# Patient Record
Sex: Male | Born: 1970 | Race: Black or African American | Hispanic: No | Marital: Single | Smoking: Never smoker
Health system: Southern US, Community
[De-identification: ages and names within clinical notes are randomized; demographics above are authoritative.]

## PROBLEM LIST (undated history)

## (undated) DIAGNOSIS — M109 Gout, unspecified: Secondary | ICD-10-CM

## (undated) DIAGNOSIS — M1990 Unspecified osteoarthritis, unspecified site: Secondary | ICD-10-CM

## (undated) DIAGNOSIS — K429 Umbilical hernia without obstruction or gangrene: Secondary | ICD-10-CM

## (undated) DIAGNOSIS — E119 Type 2 diabetes mellitus without complications: Secondary | ICD-10-CM

## (undated) DIAGNOSIS — R112 Nausea with vomiting, unspecified: Secondary | ICD-10-CM

## (undated) DIAGNOSIS — R519 Headache, unspecified: Secondary | ICD-10-CM

## (undated) DIAGNOSIS — R7303 Prediabetes: Secondary | ICD-10-CM

## (undated) DIAGNOSIS — G4730 Sleep apnea, unspecified: Secondary | ICD-10-CM

## (undated) DIAGNOSIS — I10 Essential (primary) hypertension: Secondary | ICD-10-CM

## (undated) DIAGNOSIS — Z9889 Other specified postprocedural states: Secondary | ICD-10-CM

## (undated) HISTORY — DX: Morbid (severe) obesity due to excess calories: E66.01

## (undated) HISTORY — PX: KNEE ARTHROSCOPY: SUR90

## (undated) HISTORY — DX: Umbilical hernia without obstruction or gangrene: K42.9

## (undated) HISTORY — DX: Sleep apnea, unspecified: G47.30

## (undated) HISTORY — DX: Gout, unspecified: M10.9

---

## 2016-02-06 ENCOUNTER — Encounter (HOSPITAL_COMMUNITY): Payer: Self-pay | Admitting: Emergency Medicine

## 2016-02-06 ENCOUNTER — Emergency Department (HOSPITAL_COMMUNITY)
Admission: EM | Admit: 2016-02-06 | Discharge: 2016-02-06 | Disposition: A | Discharge status: Home / Self Care | Payer: 59 | Attending: Emergency Medicine | Admitting: Emergency Medicine

## 2016-02-06 DIAGNOSIS — J039 Acute tonsillitis, unspecified: Secondary | ICD-10-CM | POA: Insufficient documentation

## 2016-02-06 DIAGNOSIS — I10 Essential (primary) hypertension: Secondary | ICD-10-CM | POA: Insufficient documentation

## 2016-02-06 DIAGNOSIS — E119 Type 2 diabetes mellitus without complications: Secondary | ICD-10-CM | POA: Insufficient documentation

## 2016-02-06 DIAGNOSIS — J029 Acute pharyngitis, unspecified: Secondary | ICD-10-CM | POA: Diagnosis present

## 2016-02-06 HISTORY — DX: Essential (primary) hypertension: I10

## 2016-02-06 HISTORY — DX: Type 2 diabetes mellitus without complications: E11.9

## 2016-02-06 LAB — RAPID STREP SCREEN (MED CTR MEBANE ONLY): STREPTOCOCCUS, GROUP A SCREEN (DIRECT): NEGATIVE

## 2016-02-06 MED ORDER — HYDROCODONE-ACETAMINOPHEN 5-325 MG PO TABS
2.0000 | ORAL_TABLET | Freq: Once | ORAL | Status: AC
  Administered 2016-02-06: 2 via ORAL
  Filled 2016-02-06: qty 2

## 2016-02-06 MED ORDER — HYDROCODONE-ACETAMINOPHEN 5-325 MG PO TABS: 1.0000 | ORAL_TABLET | Freq: Four times a day (QID) | ORAL | 0 refills | Status: DC | PRN

## 2016-02-06 MED ORDER — CLINDAMYCIN HCL 150 MG PO CAPS: 300.0000 mg | ORAL_CAPSULE | Freq: Three times a day (TID) | ORAL | 0 refills | Status: DC

## 2016-02-06 MED ORDER — DEXAMETHASONE SODIUM PHOSPHATE 10 MG/ML IJ SOLN
10.0000 mg | Freq: Once | INTRAMUSCULAR | Status: AC
  Administered 2016-02-06: 10 mg via INTRAMUSCULAR
  Filled 2016-02-06: qty 1

## 2016-02-06 NOTE — ED Notes (Signed)
Declined W/C at D/C and was escorted to lobby by RN. 

## 2016-02-06 NOTE — ED Provider Notes (Signed)
[facility] DEPT Provider Note   CSN: [id number] Arrival date & time: 02/06/16  1413     History   Chief Complaint Chief Complaint  Patient presents with  . Sore Throat    HPI Edgar Myers is a 45 y.o. male.  Patient presents to the emergency department with chief complaint of sore throat. He states that he was sent to the emergency department by a [facility] to be reassessed for possible peritonsillar abscess. Patient reports having a sore throat for the past 3-4 days. He denies any known fever, but is noted to be febrile today to 101.6. He denies any cough, but states that he has had sinus congestion. States that he saw his primary care doctor yesterday for the sinus congestion, and was prescribed doxycycline. He has been taking this. He states that his throat hurt so much it is hard to swallow, but he is able to swallow.   The history is provided by the patient. No language interpreter was used.    Past Medical History:  Diagnosis Date  . Diabetes mellitus without complication ([place])    pre-diabetic  . Hypertension     There are no active problems to display for this patient.   History reviewed. No pertinent surgical history.     Home Medications    Prior to Admission medications   Not on File    Family History History reviewed. No pertinent family history.  Social History Social History  Substance Use Topics  . Smoking status: Never Smoker  . Smokeless tobacco: Never Used  . Alcohol use No     Allergies   Review of patient's allergies indicates no known allergies.   Review of Systems Review of Systems  HENT: Positive for sore throat.   All other systems reviewed and are negative.    Physical Exam Updated Vital Signs BP 114/65 (BP Location: Right Arm)   Pulse 81   Temp 101.6 F (38.7 C) (Oral)   Ht 5\' 10"  (1.778 m)   Wt (!) 190.5 kg   SpO2 96%   BMI 60.26 kg/m   Physical Exam  Constitutional: He is oriented to person, place,  and time. He appears well-developed and well-nourished.  HENT:  Head: Normocephalic and atraumatic.  Bilateral oropharynx is remarkable for swollen tonsils with exudates, without evidence of peritonsillar abscess, uvula is midline, airway intact, tolerating secretions  Eyes: Conjunctivae and EOM are normal. Pupils are equal, round, and reactive to light. Right eye exhibits no discharge. Left eye exhibits no discharge. No scleral icterus.  Neck: Normal range of motion. Neck supple. No JVD present.  Cardiovascular: Normal rate, regular rhythm and normal heart sounds.  Exam reveals no gallop and no friction rub.   No murmur heard. Pulmonary/Chest: Effort normal and breath sounds normal. No respiratory distress. He has no wheezes. He has no rales. He exhibits no tenderness.  Abdominal: Soft. He exhibits no distension and no mass. There is no tenderness. There is no rebound and no guarding.  Musculoskeletal: Normal range of motion. He exhibits no edema or tenderness.  Neurological: He is alert and oriented to person, place, and time.  Skin: Skin is warm and dry.  Psychiatric: He has a normal mood and affect. His behavior is normal. Judgment and thought content normal.  Nursing note and vitals reviewed.    ED Treatments / Results  Labs (all labs ordered are listed, but only abnormal results are displayed) Labs Reviewed  RAPID STREP SCREEN (NOT AT [facility])    EKG  EKG Interpretation None       Radiology No results found.  Procedures Procedures (including critical care time)  Medications Ordered in ED Medications  HYDROcodone-acetaminophen (NORCO/VICODIN) 5-325 MG per tablet 2 tablet (2 tablets Oral Given 02/06/16 1458)  dexamethasone (DECADRON) injection 10 mg (10 mg Intramuscular Given 02/06/16 1527)     Initial Impression / Assessment and Plan / ED Course  I have reviewed the triage vital signs and the nursing notes.  Pertinent labs & imaging results that were available during  my care of the patient were reviewed by me and considered in my medical decision making (see chart for details).  Clinical Course    Patient with tonsillitis. There is no evidence of peritonsillar abscess. He is tolerating orals. Given Vicodin, which should help with fever. I will prescribe clindamycin given the severe case of tonsillitis.  Also given 10mg  decadron in the ED.  Vitals:   02/06/16 1418 02/06/16 1534  BP: 114/65 118/68  Pulse: 81 79  Resp:  18  Temp: 101.6 F (38.7 C) 100.6 F (38.1 C)    Final Clinical Impressions(s) / ED Diagnoses   Final diagnoses:  Tonsillitis    New Prescriptions New Prescriptions   CLINDAMYCIN (CLEOCIN) 150 MG CAPSULE    Take 2 capsules (300 mg total) by mouth 3 (three) times daily. May dispense as 150mg  capsules   HYDROCODONE-ACETAMINOPHEN (NORCO/VICODIN) 5-325 MG TABLET    Take 1-2 tablets by mouth every 6 (six) hours as needed.         Montine Circle, PA-C 02/06/16 Aguilar, MD 02/11/16 [id number]

## 2016-02-06 NOTE — ED Triage Notes (Signed)
Sent from [facility] for sore throat for 5 days, enlarged tonsils, and concern for tonsillar abscess.

## 2016-02-08 LAB — CULTURE, GROUP A STREP (THRC)

## 2017-02-01 ENCOUNTER — Other Ambulatory Visit (HOSPITAL_COMMUNITY): Payer: Self-pay | Admitting: General Surgery

## 2017-06-22 ENCOUNTER — Encounter: Payer: Self-pay | Admitting: Skilled Nursing Facility1

## 2017-06-22 ENCOUNTER — Encounter: Payer: 59 | Attending: General Surgery | Admitting: Skilled Nursing Facility1

## 2017-06-22 DIAGNOSIS — G4733 Obstructive sleep apnea (adult) (pediatric): Secondary | ICD-10-CM | POA: Diagnosis not present

## 2017-06-22 DIAGNOSIS — Z8249 Family history of ischemic heart disease and other diseases of the circulatory system: Secondary | ICD-10-CM | POA: Diagnosis not present

## 2017-06-22 DIAGNOSIS — E119 Type 2 diabetes mellitus without complications: Secondary | ICD-10-CM

## 2017-06-22 DIAGNOSIS — Z6841 Body Mass Index (BMI) 40.0 and over, adult: Secondary | ICD-10-CM | POA: Insufficient documentation

## 2017-06-22 DIAGNOSIS — Z79899 Other long term (current) drug therapy: Secondary | ICD-10-CM | POA: Insufficient documentation

## 2017-06-22 DIAGNOSIS — Z713 Dietary counseling and surveillance: Secondary | ICD-10-CM | POA: Diagnosis present

## 2017-06-22 DIAGNOSIS — E785 Hyperlipidemia, unspecified: Secondary | ICD-10-CM | POA: Diagnosis not present

## 2017-06-22 DIAGNOSIS — M5441 Lumbago with sciatica, right side: Secondary | ICD-10-CM | POA: Insufficient documentation

## 2017-06-22 DIAGNOSIS — M5442 Lumbago with sciatica, left side: Secondary | ICD-10-CM | POA: Diagnosis not present

## 2017-06-22 DIAGNOSIS — I10 Essential (primary) hypertension: Secondary | ICD-10-CM | POA: Insufficient documentation

## 2017-06-22 DIAGNOSIS — Z7984 Long term (current) use of oral hypoglycemic drugs: Secondary | ICD-10-CM | POA: Insufficient documentation

## 2017-06-22 DIAGNOSIS — Z8261 Family history of arthritis: Secondary | ICD-10-CM | POA: Diagnosis not present

## 2017-06-22 NOTE — Progress Notes (Signed)
Pre-Op Assessment Visit:  Pre-Operative Sleeve Gastrecomty Surgery  Medical Nutrition Therapy:  Appt start time: 8:50  End time:  9:50  Patient was seen on 06/22/2017 for Pre-Operative Nutrition Assessment. Assessment and letter of approval faxed to [facility] Bariatric Surgery Program coordinator on 06/22/2017.   Pt arrived with his wife. Pt states he has never been educated on diabetes. Pt states he has had diabetes for about 2 years with not checking his blood sugar and does not know what his A1C is or what it is in general. Pt states he does not wear his C-PAP. Pts work schedule Rotating 12 hours night and day. Pt admitted the changes to his diet and moving more will be difficult.  Educate pt on diabetes for next time: difference between type 1 and type 2.   Surgeon wants pt to lose 30-40 pounds before surgery.   Pt expectation of surgery: To get healthy what do u expec tyuor life to be lik   Pt expectation of Dietitian: to inform me   Start weight at NDES: 456 BMI: 67.34  24 hr Dietary Recall: First Meal:  Snack: cookies Second Meal:  Snack: ice cream Third Meal: fast food Snack: chips Beverages: sweet tea, soda, lemonade, cool aid, water  Encouraged to engage in 150 minutes of moderate physical activity including cardiovascular and weight baring weekly  Handouts given during visit include:  . Pre-Op Goals . Bariatric Surgery Protein Shakes During the appointment today the following Pre-Op Goals were reviewed with the patient: . Maintain or lose weight as instructed by your surgeon . Make healthy food choices . Begin to limit portion sizes . Limited concentrated sugars and fried foods . Keep fat/sugar in the single digits per serving on             food labels . Practice CHEWING your food  (aim for 30 chews per bite or until applesauce consistency) . Practice not drinking 15 minutes before, during, and 30 minutes after each meal/snack . Avoid all  carbonated beverages  . Avoid/limit caffeinated beverages  . Avoid all sugar-sweetened beverages . Consume 3 meals per day; eat every 3-5 hours . Make a list of non-food related activities . Aim for 64-100 ounces of FLUID daily  . Aim for at least 60-80 grams of PROTEIN daily . Look for a liquid protein source that contain ?15 g protein and ?5 g carbohydrate  (ex: shakes, drinks, shots)  -Follow diet recommendations listed below   Energy and Macronutrient Recomendations: Calories: 1600 Carbohydrate: 180 Protein: 120 Fat: 44  Demonstrated degree of understanding via:  Teach Back  Teaching Method Utilized:  Visual Auditory Hands on  Barriers to learning/adherence to lifestyle change: work schedule  Patient to call the Nutrition and Diabetes Education Services to enroll in Pre-Op and Post-Op Nutrition Education when surgery date is scheduled.

## 2017-06-22 NOTE — Patient Instructions (Addendum)
-  Try to wear your C-PAP every night   -Log your food for 7 days

## 2017-06-30 ENCOUNTER — Encounter: Payer: Self-pay | Admitting: *Deleted

## 2017-07-01 ENCOUNTER — Encounter: Payer: Self-pay | Admitting: *Deleted

## 2017-07-01 ENCOUNTER — Other Ambulatory Visit: Payer: Self-pay

## 2017-07-01 ENCOUNTER — Telehealth: Payer: Self-pay | Admitting: Cardiology

## 2017-07-01 ENCOUNTER — Ambulatory Visit: Payer: 59 | Admitting: Cardiology

## 2017-07-01 ENCOUNTER — Encounter: Payer: Self-pay | Admitting: Cardiology

## 2017-07-01 VITALS — BP 129/86 | HR 58 | Ht 70.0 in | Wt >= 6400 oz

## 2017-07-01 DIAGNOSIS — R0602 Shortness of breath: Secondary | ICD-10-CM | POA: Diagnosis not present

## 2017-07-01 MED ORDER — FUROSEMIDE 40 MG PO TABS: 40.0000 mg | ORAL_TABLET | Freq: Every day | ORAL | 1 refills | Status: DC

## 2017-07-01 MED ORDER — BENAZEPRIL HCL 20 MG PO TABS: 20.0000 mg | ORAL_TABLET | Freq: Every day | ORAL | 1 refills | Status: DC

## 2017-07-01 NOTE — Progress Notes (Signed)
     Clinical Summary Edgar Myers is a 47 y.o.male seen as new consult,, referred by Dr Amanda Pea for preoperative evaluation.   1. Preoperative evaluation - being considered for bariatric surgery - denies any chest pain. 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 - can walk 2+ flights, has some SOB with it.  - can have some LE edema x few years. No orthopnea  CAD risk factors: DM2, HTN - prior stress test at [facility] per his report.   2. DM2 - followed by pcp  3. OSA - poor compliance with cpap.   Past Medical History:  Diagnosis Date  . Diabetes mellitus without complication ([place])    pre-diabetic  . Hypertension      No Known Allergies   Current Outpatient Medications  Medication Sig Dispense Refill  . clindamycin (CLEOCIN) 150 MG capsule Take 2 capsules (300 mg total) by mouth 3 (three) times daily. May dispense as 150mg  capsules 42 capsule 0  . HYDROcodone-acetaminophen (NORCO/VICODIN) 5-325 MG tablet Take 1-2 tablets by mouth every 6 (six) hours as needed. 10 tablet 0   No current facility-administered medications for this visit.      No past surgical history on file.   No Known Allergies    No family history on file.   Social History Edgar Myers reports that he has never smoked. He has never used smokeless tobacco. Edgar Myers reports that he does not drink alcohol.   Review of Systems CONSTITUTIONAL: No weight loss, fever, chills, weakness or fatigue.  HEENT: Eyes: No visual loss, blurred vision, double vision or yellow sclerae.No hearing loss, sneezing, congestion, runny nose or sore throat.  SKIN: No rash or itching.  CARDIOVASCULAR: per hpi RESPIRATORY: per hpi GASTROINTESTINAL: No anorexia, nausea, vomiting or diarrhea. No abdominal pain or blood.  GENITOURINARY: No burning on urination, no polyuria NEUROLOGICAL: No headache, dizziness, syncope, paralysis, ataxia, numbness or tingling in the extremities. No change in  bowel or bladder control.  MUSCULOSKELETAL: No muscle, back pain, joint pain or stiffness.  LYMPHATICS: No enlarged nodes. No history of splenectomy.  PSYCHIATRIC: No history of depression or anxiety.  ENDOCRINOLOGIC: No reports of sweating, cold or heat intolerance. No polyuria or polydipsia.  Marland Kitchen   Physical Examination Vitals:   07/01/17 1335  BP: 129/86  Pulse: (!) 58  SpO2: 95%   Vitals:   07/01/17 1335  Weight: (!) 456 lb (206.8 kg)  Height: 5\' 10"  (1.778 m)    Gen: resting comfortably, no acute distress HEENT: no scleral icterus, pupils equal round and reactive, no palptable cervical adenopathy,  CV: RRR, no m/r/g, no jvd Resp: Clear to auscultation bilaterally GI: abdomen is soft, non-tender, non-distended, normal bowel sounds, no hepatosplenomegaly MSK: extremities are warm, no edema.  Skin: warm, no rash Neuro:  no focal deficits Psych: appropriate affect     Assessment and Plan  1. Preoperative evaluation - patient being considered for bariatric surgery - has had some recent DOE, LE edema at times - we will obtain an echo to evaluate for underlying cardiac dysfunction.  - by report he can tolerate greater than 4 METs at home though does have some symptoms at this level.  - f/u echo results. His weight would limit his candidacy for any noninvasive ischemic testing, unless by chance he happens to have clear echo windows then could consider DSE if indicated       Arnoldo Lenis, M.D.

## 2017-07-01 NOTE — Telephone Encounter (Signed)
Pre-cert Verification for the following procedure   Echo scheduled for 07-06-17 at [facility]

## 2017-07-01 NOTE — Patient Instructions (Signed)
Your physician recommends that you schedule a follow-up appointment in: TO BE DETERMINED WITH DR [facility]  Your physician has recommended you make the following change in your medication:   STOP LOTENSIN-HCTZ  START BENAZEPRIL 20 MG DAILY  START LASIX 103 MG DAILY  Your physician recommends that you return for lab work in: [place] BMP/MG - [place] physician has requested that you have an echocardiogram. Echocardiography is a painless test that uses sound waves to create images of your heart. It provides your doctor with information about the size and shape of your heart and how well your heart's chambers and valves are working. This procedure takes approximately one hour. There are no restrictions for this procedure.  Thank you for choosing [facility]!!

## 2017-07-04 ENCOUNTER — Encounter: Payer: Self-pay | Admitting: Cardiology

## 2017-07-06 ENCOUNTER — Ambulatory Visit (HOSPITAL_COMMUNITY)
Admission: RE | Admit: 2017-07-06 | Discharge: 2017-07-06 | Disposition: A | Discharge status: Home / Self Care | Payer: 59 | Source: Ambulatory Visit | Attending: Cardiology | Admitting: Cardiology

## 2017-07-06 DIAGNOSIS — I119 Hypertensive heart disease without heart failure: Secondary | ICD-10-CM | POA: Diagnosis not present

## 2017-07-06 DIAGNOSIS — E119 Type 2 diabetes mellitus without complications: Secondary | ICD-10-CM | POA: Insufficient documentation

## 2017-07-06 DIAGNOSIS — R0602 Shortness of breath: Secondary | ICD-10-CM | POA: Insufficient documentation

## 2017-07-06 MED ORDER — PERFLUTREN LIPID MICROSPHERE
1.0000 mL | INTRAVENOUS | Status: AC | PRN
  Administered 2017-07-06: 2 mL via INTRAVENOUS
  Filled 2017-07-06: qty 10

## 2017-07-06 NOTE — Progress Notes (Signed)
*  PRELIMINARY RESULTS* Echocardiogram 2D Echocardiogram WITH DEFINITY has been performed.  Leavy Cella 07/06/2017, 12:32 PM

## 2017-07-15 ENCOUNTER — Encounter: Payer: Self-pay | Admitting: *Deleted

## 2017-07-15 ENCOUNTER — Telehealth: Payer: Self-pay | Admitting: Cardiology

## 2017-07-15 NOTE — Telephone Encounter (Signed)
LM to return call    Echo looks good, normal heart function. I dont' see anything to keep him from pursuing bariatric surgery if needed. Can we get his prior stress test result from Robley Fries MD

## 2017-07-15 NOTE — Telephone Encounter (Signed)
Pt aware and voiced understanding - routed to pcp and Dr [name]. Will confirm with Dr Harl Bowie when pt needs to f/u - pt says he will be having labs done at [facility] this week.

## 2017-07-15 NOTE — Telephone Encounter (Signed)
Returning call.

## 2017-07-15 NOTE — Telephone Encounter (Signed)
Mr. Fan called back to [place] office requesting to speak with Staci in regards to having weight loss surgery.Does patient need to come back for another appointment. Please call [phone].

## 2017-07-21 ENCOUNTER — Encounter: Payer: 59 | Attending: General Surgery | Admitting: Skilled Nursing Facility1

## 2017-07-21 ENCOUNTER — Encounter: Payer: Self-pay | Admitting: Skilled Nursing Facility1

## 2017-07-21 DIAGNOSIS — M5441 Lumbago with sciatica, right side: Secondary | ICD-10-CM | POA: Insufficient documentation

## 2017-07-21 DIAGNOSIS — M5442 Lumbago with sciatica, left side: Secondary | ICD-10-CM | POA: Diagnosis not present

## 2017-07-21 DIAGNOSIS — E119 Type 2 diabetes mellitus without complications: Secondary | ICD-10-CM | POA: Insufficient documentation

## 2017-07-21 DIAGNOSIS — Z6841 Body Mass Index (BMI) 40.0 and over, adult: Secondary | ICD-10-CM | POA: Diagnosis not present

## 2017-07-21 DIAGNOSIS — Z713 Dietary counseling and surveillance: Secondary | ICD-10-CM | POA: Diagnosis present

## 2017-07-21 DIAGNOSIS — Z79899 Other long term (current) drug therapy: Secondary | ICD-10-CM | POA: Diagnosis not present

## 2017-07-21 DIAGNOSIS — E785 Hyperlipidemia, unspecified: Secondary | ICD-10-CM | POA: Insufficient documentation

## 2017-07-21 DIAGNOSIS — I10 Essential (primary) hypertension: Secondary | ICD-10-CM | POA: Insufficient documentation

## 2017-07-21 DIAGNOSIS — Z7984 Long term (current) use of oral hypoglycemic drugs: Secondary | ICD-10-CM | POA: Insufficient documentation

## 2017-07-21 DIAGNOSIS — G4733 Obstructive sleep apnea (adult) (pediatric): Secondary | ICD-10-CM | POA: Insufficient documentation

## 2017-07-21 DIAGNOSIS — Z8261 Family history of arthritis: Secondary | ICD-10-CM | POA: Diagnosis not present

## 2017-07-21 DIAGNOSIS — Z8249 Family history of ischemic heart disease and other diseases of the circulatory system: Secondary | ICD-10-CM | POA: Diagnosis not present

## 2017-07-21 NOTE — Patient Instructions (Addendum)
-  Wear your C-PAP every night: start by cleaning   -Put balanced meals together using your sheet  -Keep working on getting in your water and cutting back on soda  -Chew until applesauce consistency

## 2017-07-21 NOTE — Progress Notes (Signed)
Pre-Op Assessment Visit:  Pre-Operative Sleeve Gastrecomty Surgery  Medical Nutrition Therapy:  Appt start time: 8:50  End time:  9:50  Patient was seen on 06/22/2017 for Pre-Operative Nutrition Assessment. Assessment and letter of approval faxed to [facility] Bariatric Surgery Program coordinator on 06/22/2017.   Needs 6 months SWL.   Pt arrived with his wife. Pt states he has never been educated on diabetes. Pt states he has had diabetes for about 2 years with not checking his blood sugar and does not know what his A1C is or what it is in general and states he has not been taking the metformin for the past year and thinks he really does not have it stating he checks his blood suagr randomly always under 100. Pt states he does not wear his C-PAP. Pts work schedule Rotating 12 hours night and day. Pt admitted the changes to his diet and moving more will be difficult.   Surgeon wants pt to lose 30-40 pounds before surgery.  Pt arrives having gained about 2 pounds. Pt states he had had a fluid pill added. Pt states he has been drinking more water, trying to eat 3 times day,;Pt states the long walk into work is breathing better. Pt states he started off good eating cereal and oatmeal stating there is an in [employer] used to be grits and bacon, staying away from soda at work. Cut out mcdonalds and the sweet tea. pts wife lives in [place] and does not live with pt full time.  Handouts given: Meal ideas  Start weight at NDES: 456 Weight: 458 BMI: 67.63  24 hr Dietary Recall: First Meal:  Snack: cookies Second Meal:  Snack: ice cream Third Meal: fast food Snack: chips Beverages: sweet tea, soda, lemonade, cool aid, water  Encouraged to engage in 150 minutes of moderate physical activity including cardiovascular and weight baring weekly  Goals: -Wear your C-PAP every night: start by cleaning   -Put balanced meals together using your sheet  -Keep working on getting in  your water and cutting back on soda  -Chew until applesauce consistency    -Follow diet recommendations listed below   Energy and Macronutrient Recomendations: Calories: 1600 Carbohydrate: 180 Protein: 120 Fat: 44  Demonstrated degree of understanding via:  Teach Back  Teaching Method Utilized:  Visual Auditory Hands on  Barriers to learning/adherence to lifestyle change: work schedule  Patient to call the Nutrition and Diabetes Education Services to enroll in Pre-Op and Post-Op Nutrition Education when surgery date is scheduled.

## 2017-08-18 ENCOUNTER — Encounter: Payer: 59 | Attending: General Surgery | Admitting: Skilled Nursing Facility1

## 2017-08-18 ENCOUNTER — Encounter: Payer: Self-pay | Admitting: Skilled Nursing Facility1

## 2017-08-18 DIAGNOSIS — Z713 Dietary counseling and surveillance: Secondary | ICD-10-CM | POA: Insufficient documentation

## 2017-08-18 DIAGNOSIS — Z7984 Long term (current) use of oral hypoglycemic drugs: Secondary | ICD-10-CM | POA: Insufficient documentation

## 2017-08-18 DIAGNOSIS — M5442 Lumbago with sciatica, left side: Secondary | ICD-10-CM | POA: Diagnosis not present

## 2017-08-18 DIAGNOSIS — E119 Type 2 diabetes mellitus without complications: Secondary | ICD-10-CM | POA: Diagnosis not present

## 2017-08-18 DIAGNOSIS — Z6841 Body Mass Index (BMI) 40.0 and over, adult: Secondary | ICD-10-CM | POA: Insufficient documentation

## 2017-08-18 DIAGNOSIS — Z8261 Family history of arthritis: Secondary | ICD-10-CM | POA: Insufficient documentation

## 2017-08-18 DIAGNOSIS — I10 Essential (primary) hypertension: Secondary | ICD-10-CM | POA: Insufficient documentation

## 2017-08-18 DIAGNOSIS — Z8249 Family history of ischemic heart disease and other diseases of the circulatory system: Secondary | ICD-10-CM | POA: Diagnosis not present

## 2017-08-18 DIAGNOSIS — G4733 Obstructive sleep apnea (adult) (pediatric): Secondary | ICD-10-CM | POA: Insufficient documentation

## 2017-08-18 DIAGNOSIS — Z79899 Other long term (current) drug therapy: Secondary | ICD-10-CM | POA: Insufficient documentation

## 2017-08-18 DIAGNOSIS — E785 Hyperlipidemia, unspecified: Secondary | ICD-10-CM | POA: Diagnosis not present

## 2017-08-18 DIAGNOSIS — M5441 Lumbago with sciatica, right side: Secondary | ICD-10-CM | POA: Diagnosis not present

## 2017-08-18 DIAGNOSIS — E669 Obesity, unspecified: Secondary | ICD-10-CM

## 2017-08-18 NOTE — Progress Notes (Signed)
Pre-Op Assessment Visit:  Pre-Operative Sleeve Gastrecomty Surgery  Medical Nutrition Therapy:  Appt start time: 8:50  End time:  9:50  Patient was seen on 06/22/2017 for Pre-Operative Nutrition Assessment. Assessment and letter of approval faxed to [facility] Bariatric Surgery Program coordinator on 06/22/2017.   Needs 6 months SWL.   Surgeon wants pt to lose 30-40 pounds before surgery.  Pt states his wife is 7 months along.  Pt arrives having lost about 6 pounds. Pt states he has not been wearing his C-PAP. Pt states he no longer eats at [employer]. Pt states he is slowly but surely trying to be mindful about what he eats and how much he eats. Pt states as hotter temperatures occur he drinks a significant amount of caloric drinks and does not eat as much. Pt states he has used the meal ideas sheet a couple of times to put balanced meals together and is going to continue to work on that.   Start weight at NDES: 456 Weight: 458 BMI: 67.63  24 hr Dietary Recall: First Meal:  Snack: cookies Second Meal:  Snack: ice cream Third Meal: fast food Snack: chips Beverages: sweet tea, soda, lemonade, cool aid, water  Encouraged to engage in 150 minutes of moderate physical activity including cardiovascular and weight baring weekly  Goals: -Try the sugar free popcicles, sugar free natures twist, any sugar free powders or drops -Try to wear your C-PAP -Continue to put balanced meals together  -Aim for the lighter/crisper foods instead of caloric drinks -Continue to chew well  -Follow diet recommendations listed below   Energy and Macronutrient Recomendations: Calories: 1600 Carbohydrate: 180 Protein: 120 Fat: 44  Demonstrated degree of understanding via:  Teach Back  Teaching Method Utilized:  Visual Auditory Hands on  Barriers to learning/adherence to lifestyle change: work schedule  Patient to call the Nutrition and Diabetes Education Services to enroll in Pre-Op  and Post-Op Nutrition Education when surgery date is scheduled.

## 2017-08-18 NOTE — Patient Instructions (Addendum)
-  Try the sugar free popcicles, sugar free natures twist, any sugar free powders or drops  -Try to wear your C-PAP  -Continue to put balanced meals together   -Aim for the lighter/crisper foods instead of caloric drinks  -Continue to chew well

## 2017-09-15 ENCOUNTER — Encounter: Payer: Self-pay | Admitting: Skilled Nursing Facility1

## 2017-09-15 ENCOUNTER — Encounter: Payer: 59 | Attending: General Surgery | Admitting: Skilled Nursing Facility1

## 2017-09-15 DIAGNOSIS — M5442 Lumbago with sciatica, left side: Secondary | ICD-10-CM | POA: Diagnosis not present

## 2017-09-15 DIAGNOSIS — E785 Hyperlipidemia, unspecified: Secondary | ICD-10-CM | POA: Diagnosis not present

## 2017-09-15 DIAGNOSIS — I10 Essential (primary) hypertension: Secondary | ICD-10-CM | POA: Insufficient documentation

## 2017-09-15 DIAGNOSIS — Z7984 Long term (current) use of oral hypoglycemic drugs: Secondary | ICD-10-CM | POA: Insufficient documentation

## 2017-09-15 DIAGNOSIS — E119 Type 2 diabetes mellitus without complications: Secondary | ICD-10-CM | POA: Diagnosis not present

## 2017-09-15 DIAGNOSIS — M5441 Lumbago with sciatica, right side: Secondary | ICD-10-CM | POA: Diagnosis not present

## 2017-09-15 DIAGNOSIS — Z713 Dietary counseling and surveillance: Secondary | ICD-10-CM | POA: Insufficient documentation

## 2017-09-15 DIAGNOSIS — Z8261 Family history of arthritis: Secondary | ICD-10-CM | POA: Diagnosis not present

## 2017-09-15 DIAGNOSIS — G4733 Obstructive sleep apnea (adult) (pediatric): Secondary | ICD-10-CM | POA: Insufficient documentation

## 2017-09-15 DIAGNOSIS — Z6841 Body Mass Index (BMI) 40.0 and over, adult: Secondary | ICD-10-CM | POA: Diagnosis not present

## 2017-09-15 DIAGNOSIS — Z79899 Other long term (current) drug therapy: Secondary | ICD-10-CM | POA: Insufficient documentation

## 2017-09-15 DIAGNOSIS — Z8249 Family history of ischemic heart disease and other diseases of the circulatory system: Secondary | ICD-10-CM | POA: Insufficient documentation

## 2017-09-15 DIAGNOSIS — E669 Obesity, unspecified: Secondary | ICD-10-CM

## 2017-09-15 NOTE — Progress Notes (Signed)
Pre-Op Assessment Visit:  Pre-Operative Sleeve Gastrecomty Surgery  Medical Nutrition Therapy:  Appt start time: 8:50  End time:  9:50  Patient was seen on 06/22/2017 for Pre-Operative Nutrition Assessment. Assessment and letter of approval faxed to [facility] Bariatric Surgery Program coordinator on 06/22/2017.   Needs 6 months SWL.   Surgeon wants pt to lose 30-40 pounds before surgery.  Pt arrives having lost about 9 pounds. Pt states he is not as tired and not having as much pain with walking [place]. Pt states due to the heat he has not been eating enough and only eating 1-2 times a day. Pt states he has been trying to drink more water. Pt states his wife is due in 4 weeks. Pt states he is working on everything but it has been difficult.    Start weight at NDES: 456 Weight: 450.9 BMI: 66.59  24 hr Dietary Recall: First Meal: skipped  Snack: cookies Second Meal:  Snack: ice cream Third Meal: fast food Snack: chips Beverages: sweet tea, soda, lemonade, cool aid, water  Encouraged to engage in 150 minutes of moderate physical activity including cardiovascular and weight baring weekly  Goals: -Try the sugar free popcicles, sugar free natures twist, any sugar free powders or drops -Continue to put balanced meals together  -Aim for the lighter/crisper foods instead of caloric drinks -Continue to chew well -Eat 3 meals a day  -Follow diet recommendations listed below   Energy and Macronutrient Recomendations: Calories: 1600 Carbohydrate: 180 Protein: 120 Fat: 44  Demonstrated degree of understanding via:  Teach Back  Teaching Method Utilized:  Visual Auditory Hands on  Barriers to learning/adherence to lifestyle change: work schedule  Patient to call the Nutrition and Diabetes Education Services to enroll in Pre-Op and Post-Op Nutrition Education when surgery date is scheduled.

## 2017-10-20 ENCOUNTER — Ambulatory Visit: Payer: 59 | Admitting: Skilled Nursing Facility1

## 2017-11-01 ENCOUNTER — Encounter: Payer: Self-pay | Admitting: Skilled Nursing Facility1

## 2017-11-01 ENCOUNTER — Encounter: Payer: 59 | Attending: General Surgery | Admitting: Skilled Nursing Facility1

## 2017-11-01 DIAGNOSIS — Z79899 Other long term (current) drug therapy: Secondary | ICD-10-CM | POA: Insufficient documentation

## 2017-11-01 DIAGNOSIS — M5442 Lumbago with sciatica, left side: Secondary | ICD-10-CM | POA: Diagnosis not present

## 2017-11-01 DIAGNOSIS — E785 Hyperlipidemia, unspecified: Secondary | ICD-10-CM | POA: Insufficient documentation

## 2017-11-01 DIAGNOSIS — Z8261 Family history of arthritis: Secondary | ICD-10-CM | POA: Insufficient documentation

## 2017-11-01 DIAGNOSIS — Z8249 Family history of ischemic heart disease and other diseases of the circulatory system: Secondary | ICD-10-CM | POA: Diagnosis not present

## 2017-11-01 DIAGNOSIS — E119 Type 2 diabetes mellitus without complications: Secondary | ICD-10-CM | POA: Diagnosis not present

## 2017-11-01 DIAGNOSIS — G4733 Obstructive sleep apnea (adult) (pediatric): Secondary | ICD-10-CM | POA: Insufficient documentation

## 2017-11-01 DIAGNOSIS — I10 Essential (primary) hypertension: Secondary | ICD-10-CM | POA: Diagnosis not present

## 2017-11-01 DIAGNOSIS — Z6841 Body Mass Index (BMI) 40.0 and over, adult: Secondary | ICD-10-CM | POA: Diagnosis not present

## 2017-11-01 DIAGNOSIS — Z713 Dietary counseling and surveillance: Secondary | ICD-10-CM | POA: Insufficient documentation

## 2017-11-01 DIAGNOSIS — Z7984 Long term (current) use of oral hypoglycemic drugs: Secondary | ICD-10-CM | POA: Diagnosis not present

## 2017-11-01 DIAGNOSIS — M5441 Lumbago with sciatica, right side: Secondary | ICD-10-CM | POA: Insufficient documentation

## 2017-11-01 DIAGNOSIS — E669 Obesity, unspecified: Secondary | ICD-10-CM

## 2017-11-01 NOTE — Progress Notes (Signed)
Pre-Operative Sleeve Gastrecomty Surgery  Medical Nutrition Therapy:  Appt start time: 8:50  End time:  9:50  Patient was seen on 06/22/2017 for Pre-Operative Nutrition Assessment. Assessment and letter of approval faxed to [facility] Surgery Bariatric Surgery Program coordinator on 06/22/2017.   Needs 6 months SWL.   Surgeon wants pt to lose 30-40 pounds before surgery.  Pt arrives having gained about 7 pounds. Pt states he now has a new baby boy and his wives step father passed away. The past month has been hectic resulting in no changes and back to how he was originally eating and drinking.   Start weight at NDES: 456 Weight: 457 BMI: 67.55  24 hr Dietary Recall: First Meal: skipped  Snack: cookies Second Meal:  Snack: ice cream Third Meal: fast food Snack: chips Beverages: sweet tea, soda, lemonade, cool aid, water  Encouraged to engage in 150 minutes of moderate physical activity including cardiovascular and weight baring weekly  Goals: -Keep a cooler in your truck of sugar free beverages and fruits and vegetable snacks -keep water in the refrigerator so you do not have to go near the soda cooler  -Follow diet recommendations listed below   Energy and Macronutrient Recomendations: Calories: 1600 Carbohydrate: 180 Protein: 120 Fat: 44  Demonstrated degree of understanding via:  Teach Back  Teaching Method Utilized:  Visual Auditory Hands on  Barriers to learning/adherence to lifestyle change: work schedule/contempplative stage of change   Patient to call the Nutrition and Diabetes Education Services to enroll in Pre-Op and Post-Op Nutrition Education when surgery date is scheduled.

## 2017-11-23 ENCOUNTER — Encounter: Payer: 59 | Attending: General Surgery | Admitting: Skilled Nursing Facility1

## 2017-11-23 ENCOUNTER — Encounter: Payer: Self-pay | Admitting: Skilled Nursing Facility1

## 2017-11-23 DIAGNOSIS — Z6841 Body Mass Index (BMI) 40.0 and over, adult: Secondary | ICD-10-CM | POA: Insufficient documentation

## 2017-11-23 DIAGNOSIS — G4733 Obstructive sleep apnea (adult) (pediatric): Secondary | ICD-10-CM | POA: Diagnosis not present

## 2017-11-23 DIAGNOSIS — Z8249 Family history of ischemic heart disease and other diseases of the circulatory system: Secondary | ICD-10-CM | POA: Diagnosis not present

## 2017-11-23 DIAGNOSIS — Z7984 Long term (current) use of oral hypoglycemic drugs: Secondary | ICD-10-CM | POA: Insufficient documentation

## 2017-11-23 DIAGNOSIS — Z79899 Other long term (current) drug therapy: Secondary | ICD-10-CM | POA: Insufficient documentation

## 2017-11-23 DIAGNOSIS — E119 Type 2 diabetes mellitus without complications: Secondary | ICD-10-CM | POA: Insufficient documentation

## 2017-11-23 DIAGNOSIS — M5441 Lumbago with sciatica, right side: Secondary | ICD-10-CM | POA: Insufficient documentation

## 2017-11-23 DIAGNOSIS — Z713 Dietary counseling and surveillance: Secondary | ICD-10-CM | POA: Insufficient documentation

## 2017-11-23 DIAGNOSIS — Z8261 Family history of arthritis: Secondary | ICD-10-CM | POA: Insufficient documentation

## 2017-11-23 DIAGNOSIS — E785 Hyperlipidemia, unspecified: Secondary | ICD-10-CM | POA: Insufficient documentation

## 2017-11-23 DIAGNOSIS — I10 Essential (primary) hypertension: Secondary | ICD-10-CM | POA: Insufficient documentation

## 2017-11-23 DIAGNOSIS — M5442 Lumbago with sciatica, left side: Secondary | ICD-10-CM | POA: Insufficient documentation

## 2017-11-23 DIAGNOSIS — E669 Obesity, unspecified: Secondary | ICD-10-CM

## 2017-11-23 NOTE — Progress Notes (Signed)
Pre-Operative Sleeve Gastrecomty Surgery  Medical Nutrition Therapy:  Appt start time: 8:50  End time:  9:50  Patient was seen on 06/22/2017 for Pre-Operative Nutrition Assessment. Assessment and letter of approval faxed to [facility] Surgery Bariatric Surgery Program coordinator on 06/22/2017.   Needs 6 months SWL.   Surgeon wants pt to lose 30-40 pounds before surgery.  Pt arrives having lost about 12 pounds. Pt states his baby keeps him up. Pt states he loves having a baby and he is his new motivator. Pt states his baby has reflux which has been scaring. Pt states he has not been drinking soda, having water available and eating lighter meals. Pt realized he kept telling himself I did not have anything if he did not have meat and gravy and realized he needed to change that thought and has felt better now about his oatmeal. Pt states he has done better with cutting out fast food.   Start weight at [facility]: 456 Weight: 445.9 BMI: 65.85  24 hr Dietary Recall: First Meal: fruit and oatmeal  Snack: cookies Second Meal:  Snack: ice cream Third Meal: fast food Snack: chips Beverages: sweet tea, soda, lemonade, cool aid, water  Encouraged to engage in 150 minutes of moderate physical activity including cardiovascular and weight baring weekly  Goals: -Aim to be active for 150 minutes a week -Chew until applesauce consistency to help you stay in the moment -Walk for 10 minutes a day   -Follow diet recommendations listed below   Energy and Macronutrient Recomendations: Calories: 1600 Carbohydrate: 180 Protein: 120 Fat: 44  Demonstrated degree of understanding via:  Teach Back  Teaching Method Utilized:  Visual Auditory Hands on  Barriers to learning/adherence to lifestyle change: work schedule/contempplative stage of change   Patient to call the [facility] to enroll in Pre-Op and Post-Op Nutrition Education when surgery date is  scheduled.

## 2017-12-27 ENCOUNTER — Encounter: Payer: Self-pay | Admitting: Skilled Nursing Facility1

## 2017-12-27 ENCOUNTER — Encounter: Payer: 59 | Attending: General Surgery | Admitting: Skilled Nursing Facility1

## 2017-12-27 DIAGNOSIS — Z8249 Family history of ischemic heart disease and other diseases of the circulatory system: Secondary | ICD-10-CM | POA: Insufficient documentation

## 2017-12-27 DIAGNOSIS — Z8261 Family history of arthritis: Secondary | ICD-10-CM | POA: Diagnosis not present

## 2017-12-27 DIAGNOSIS — M5441 Lumbago with sciatica, right side: Secondary | ICD-10-CM | POA: Diagnosis not present

## 2017-12-27 DIAGNOSIS — Z713 Dietary counseling and surveillance: Secondary | ICD-10-CM | POA: Insufficient documentation

## 2017-12-27 DIAGNOSIS — G4733 Obstructive sleep apnea (adult) (pediatric): Secondary | ICD-10-CM | POA: Diagnosis not present

## 2017-12-27 DIAGNOSIS — Z6841 Body Mass Index (BMI) 40.0 and over, adult: Secondary | ICD-10-CM | POA: Diagnosis not present

## 2017-12-27 DIAGNOSIS — Z79899 Other long term (current) drug therapy: Secondary | ICD-10-CM | POA: Insufficient documentation

## 2017-12-27 DIAGNOSIS — E785 Hyperlipidemia, unspecified: Secondary | ICD-10-CM | POA: Diagnosis not present

## 2017-12-27 DIAGNOSIS — Z7984 Long term (current) use of oral hypoglycemic drugs: Secondary | ICD-10-CM | POA: Diagnosis not present

## 2017-12-27 DIAGNOSIS — I10 Essential (primary) hypertension: Secondary | ICD-10-CM | POA: Insufficient documentation

## 2017-12-27 DIAGNOSIS — E119 Type 2 diabetes mellitus without complications: Secondary | ICD-10-CM | POA: Insufficient documentation

## 2017-12-27 DIAGNOSIS — M5442 Lumbago with sciatica, left side: Secondary | ICD-10-CM | POA: Insufficient documentation

## 2017-12-27 NOTE — Progress Notes (Signed)
Pre-Operative Sleeve Gastrecomty Surgery  Medical Nutrition Therapy:  Appt start time: 8:50  End time:  9:50  Patient was seen on 06/22/2017 for Pre-Operative Nutrition Assessment. Assessment and letter of approval faxed to [facility] Surgery Bariatric Surgery Program coordinator on 06/22/2017.   Needs 6 months SWL.   Surgeon wants pt to lose 30-40 pounds before surgery.  Pt arrives having lost about 3 pounds. Pt states his baby needed surgery for a vascular ring around his esophagus. Pt states due to his son situation his food has been irradic. Pt states he has acquired a taste for fruit and vegetables. Pt states he is thinking a few days ahead about what he is going to eat. Pt states walking more has helped him to feel better.  Pt works alternative shifts between 2nd and 3rd.   Start weight at [facility]: 456 Weight: 442.8 BMI: 65.39  24 hr Dietary Recall: skipping breakfast 3 days a week First Meal: fruit and oatmeal  Snack: cookies Second Meal: cheeseburger cooked at home Snack:  Third Meal: fish with squash and zucchini  Snack: chips Beverages: sweet tea/soda: lessened, lemonade, cool aid, water  Encouraged to engage in 150 minutes of moderate physical activity including cardiovascular and weight baring weekly  Goals: -Aim to be active for 150 minutes a week -Chew until applesauce consistency to help you stay in the moment -Walk for 10 minutes a day  -Continue to make healthy choices   -Follow diet recommendations listed below   Energy and Macronutrient Recomendations: Calories: 1600 Carbohydrate: 180 Protein: 120 Fat: 44  Demonstrated degree of understanding via:  Teach Back  Teaching Method Utilized:  Visual Auditory Hands on  Barriers to learning/adherence to lifestyle change: work schedule/contempplative stage of change   Patient to call the [facility] to enroll in Pre-Op and Post-Op Nutrition Education when surgery date is  scheduled.

## 2018-01-27 ENCOUNTER — Encounter: Payer: Self-pay | Admitting: Skilled Nursing Facility1

## 2018-01-27 ENCOUNTER — Encounter: Payer: 59 | Attending: General Surgery | Admitting: Skilled Nursing Facility1

## 2018-01-27 DIAGNOSIS — Z7984 Long term (current) use of oral hypoglycemic drugs: Secondary | ICD-10-CM | POA: Insufficient documentation

## 2018-01-27 DIAGNOSIS — E119 Type 2 diabetes mellitus without complications: Secondary | ICD-10-CM | POA: Insufficient documentation

## 2018-01-27 DIAGNOSIS — I10 Essential (primary) hypertension: Secondary | ICD-10-CM | POA: Insufficient documentation

## 2018-01-27 DIAGNOSIS — M5441 Lumbago with sciatica, right side: Secondary | ICD-10-CM | POA: Insufficient documentation

## 2018-01-27 DIAGNOSIS — Z79899 Other long term (current) drug therapy: Secondary | ICD-10-CM | POA: Insufficient documentation

## 2018-01-27 DIAGNOSIS — Z8249 Family history of ischemic heart disease and other diseases of the circulatory system: Secondary | ICD-10-CM | POA: Diagnosis not present

## 2018-01-27 DIAGNOSIS — G4733 Obstructive sleep apnea (adult) (pediatric): Secondary | ICD-10-CM | POA: Insufficient documentation

## 2018-01-27 DIAGNOSIS — Z6841 Body Mass Index (BMI) 40.0 and over, adult: Secondary | ICD-10-CM | POA: Diagnosis not present

## 2018-01-27 DIAGNOSIS — Z713 Dietary counseling and surveillance: Secondary | ICD-10-CM | POA: Diagnosis present

## 2018-01-27 DIAGNOSIS — Z8261 Family history of arthritis: Secondary | ICD-10-CM | POA: Diagnosis not present

## 2018-01-27 DIAGNOSIS — E785 Hyperlipidemia, unspecified: Secondary | ICD-10-CM | POA: Diagnosis not present

## 2018-01-27 DIAGNOSIS — E669 Obesity, unspecified: Secondary | ICD-10-CM

## 2018-01-27 DIAGNOSIS — M5442 Lumbago with sciatica, left side: Secondary | ICD-10-CM | POA: Diagnosis not present

## 2018-01-27 NOTE — Progress Notes (Signed)
Pre-Operative Sleeve Gastrecomty Surgery  Medical Nutrition Therapy:  Appt start time: 8:50  End time:  9:50  Patient was seen on 06/22/2017 for Pre-Operative Nutrition Assessment. Assessment and letter of approval faxed to [facility] Surgery Bariatric Surgery Program coordinator on 06/22/2017.   Needs 6 months SWL.   Surgeon wants pt to lose 30-40 pounds before surgery.  Pt works alternative shifts between 2nd and 3rd.   Pt arrives having lost about 1 pound.. Pt states his son is doing very well. Pt states he tries to incorporate more vegetables and fruits. Pt states he wants to walk 4-5 miles every other day currently walking 1.5-2 miles every other day.  Pt is half way to his goal of 30 pounds lost before surgery.  Start weight at [facility]: 456 Weight: 441 BMI: 65.12  24 hr Dietary Recall: skipping breakfast 3 days a week First Meal: fruit and oatmeal sometimes cereal sometimes biscuit from fast food Snack:  Second Meal: chicken in salad  Snack: tuna and crackers  Third Meal: fish with squash and zucchini  Snack:  Beverages: sweet tea/soda: lessened, lemonade, cool aid, water  Encouraged to engage in 150 minutes of moderate physical activity including cardiovascular and weight baring weekly  Goals: -Aim to be active for 150 minutes a week -Chew until applesauce consistency to help you stay in the moment -Walk for 10 minutes a day  -Continue to make healthy choices   -Follow diet recommendations listed below   Energy and Macronutrient Recomendations: Calories: 1600 Carbohydrate: 180 Protein: 120 Fat: 44  Demonstrated degree of understanding via:  Teach Back  Teaching Method Utilized:  Visual Auditory Hands on  Barriers to learning/adherence to lifestyle change: work schedule/contempplative stage of change   Patient to call the [facility] to enroll in Pre-Op and Post-Op Nutrition Education when surgery date is  scheduled.

## 2018-02-21 ENCOUNTER — Encounter: Payer: Self-pay | Admitting: Skilled Nursing Facility1

## 2018-02-21 ENCOUNTER — Encounter: Payer: 59 | Attending: General Surgery | Admitting: Skilled Nursing Facility1

## 2018-02-21 DIAGNOSIS — Z79899 Other long term (current) drug therapy: Secondary | ICD-10-CM | POA: Insufficient documentation

## 2018-02-21 DIAGNOSIS — E785 Hyperlipidemia, unspecified: Secondary | ICD-10-CM | POA: Diagnosis not present

## 2018-02-21 DIAGNOSIS — G4733 Obstructive sleep apnea (adult) (pediatric): Secondary | ICD-10-CM | POA: Diagnosis not present

## 2018-02-21 DIAGNOSIS — M5442 Lumbago with sciatica, left side: Secondary | ICD-10-CM | POA: Diagnosis not present

## 2018-02-21 DIAGNOSIS — Z8249 Family history of ischemic heart disease and other diseases of the circulatory system: Secondary | ICD-10-CM | POA: Diagnosis not present

## 2018-02-21 DIAGNOSIS — Z713 Dietary counseling and surveillance: Secondary | ICD-10-CM | POA: Diagnosis present

## 2018-02-21 DIAGNOSIS — Z8261 Family history of arthritis: Secondary | ICD-10-CM | POA: Insufficient documentation

## 2018-02-21 DIAGNOSIS — I10 Essential (primary) hypertension: Secondary | ICD-10-CM | POA: Insufficient documentation

## 2018-02-21 DIAGNOSIS — Z7984 Long term (current) use of oral hypoglycemic drugs: Secondary | ICD-10-CM | POA: Diagnosis not present

## 2018-02-21 DIAGNOSIS — Z6841 Body Mass Index (BMI) 40.0 and over, adult: Secondary | ICD-10-CM | POA: Insufficient documentation

## 2018-02-21 DIAGNOSIS — M5441 Lumbago with sciatica, right side: Secondary | ICD-10-CM | POA: Insufficient documentation

## 2018-02-21 DIAGNOSIS — E669 Obesity, unspecified: Secondary | ICD-10-CM

## 2018-02-21 DIAGNOSIS — E119 Type 2 diabetes mellitus without complications: Secondary | ICD-10-CM | POA: Insufficient documentation

## 2018-02-21 NOTE — Progress Notes (Signed)
Pre-Operative Nutrition Class:  Appt start time: 2671   End time:  1830.  Patient was seen on 02/21/2018 for Pre-Operative Bariatric Surgery Education [facility].   Surgery date:  Surgery type: sleeve Start weight at [facility]: 456 Weight today: 439.2  Samples given per MNT protocol. Patient educated on appropriate usage: Bariatric Advantage Multivitamin Lot # [id number] Exp:10/20  Bariatric Advantage Calcium  Lot # [id number] Exp: jul-25-2020  Renee Pain Protein Powder  Shake Lot # [id number]         March-10-2018 Exp: 06/2019        [id number]  The following the learning objectives were met by the patient during this course:  Identify Pre-Op Dietary Goals and will begin 2 weeks pre-operatively  Identify appropriate sources of fluids and proteins   State protein recommendations and appropriate sources pre and post-operatively  Identify Post-Operative Dietary Goals and will follow for 2 weeks post-operatively  Identify appropriate multivitamin and calcium sources  Describe the need for physical activity post-operatively and will follow MD recommendations  State when to call healthcare provider regarding medication questions or post-operative complications  Handouts given during class include:  Pre-Op Bariatric Surgery Diet Handout  Protein Shake Handout  Post-Op Bariatric Surgery Nutrition Handout  BELT Program Information Flyer  Support Group Information Flyer  [facility] Bariatric Supplements Price List  [facility]: [facility] per MD.

## 2018-02-24 ENCOUNTER — Encounter: Payer: 59 | Admitting: Skilled Nursing Facility1

## 2018-02-24 ENCOUNTER — Encounter: Payer: Self-pay | Admitting: Skilled Nursing Facility1

## 2018-02-24 DIAGNOSIS — E669 Obesity, unspecified: Secondary | ICD-10-CM

## 2018-02-24 DIAGNOSIS — Z713 Dietary counseling and surveillance: Secondary | ICD-10-CM | POA: Diagnosis not present

## 2018-02-24 NOTE — Progress Notes (Signed)
Pre-Operative Sleeve Gastrecomty Surgery  Medical Nutrition Therapy:  Appt start time: 8:50  End time:  9:50  Surgeon wants pt to lose 30-40 pounds before surgery. Pt works alternative shifts between 2nd and 3rd.  Pt is half way to his goal of 30 pounds lost before surgery. Pt states he is doing really well and feeling good.  Pt replacing most soda with water and is pleased with how it's making him feel. Discussed with patient balanced meals vs snacks. Pt expresses interest in incorporating physical activity (i.e walking) into his day. Encouraged to engage in 150 minutes of moderate physical activity including cardiovascular and weight baring weekly.   Start weight at [facility]: 456 Weight: 440 BMI: 64.98  24 hr Dietary Recall: skipping breakfast 3 days a week First Meal: fruit and oatmeal sometimes cereal sometimes biscuit from fast food Snack: N/A Second Meal: chicken in salad or grilled chicken wrap Snack: tuna and crackers  Third Meal: fish with squash and zucchini or broccoli  Snack: N/A Beverages: water, every now and then 12 ounce can of soda   Goals: -Aim to be active for 150 minutes a week -Aim for 3 meals a day and a maximum of 2 snacks; paying very close attention to how often you are eating  -Aim for 10 minutes of walking a day  -Continue to put together balanced meals -Follow diet recommendations listed below   Energy and Macronutrient Recomendations: Calories: 1600 Carbohydrate: 180 Protein: 120 Fat: 44  Demonstrated degree of understanding via:  Teach Back   Teaching Method Utilized:  Visual Auditory Hands on  Barriers to learning/adherence to lifestyle change: work schedule/contempplative stage of change   Patient to call the [facility] to enroll in Pre-Op and Post-Op Nutrition Education when surgery date is scheduled.

## 2018-03-21 ENCOUNTER — Encounter: Payer: 59 | Attending: General Surgery | Admitting: Skilled Nursing Facility1

## 2018-03-21 ENCOUNTER — Encounter: Payer: Self-pay | Admitting: Skilled Nursing Facility1

## 2018-03-21 DIAGNOSIS — M5441 Lumbago with sciatica, right side: Secondary | ICD-10-CM | POA: Diagnosis not present

## 2018-03-21 DIAGNOSIS — E119 Type 2 diabetes mellitus without complications: Secondary | ICD-10-CM | POA: Diagnosis not present

## 2018-03-21 DIAGNOSIS — Z8261 Family history of arthritis: Secondary | ICD-10-CM | POA: Insufficient documentation

## 2018-03-21 DIAGNOSIS — Z6841 Body Mass Index (BMI) 40.0 and over, adult: Secondary | ICD-10-CM | POA: Insufficient documentation

## 2018-03-21 DIAGNOSIS — G4733 Obstructive sleep apnea (adult) (pediatric): Secondary | ICD-10-CM | POA: Diagnosis not present

## 2018-03-21 DIAGNOSIS — M5442 Lumbago with sciatica, left side: Secondary | ICD-10-CM | POA: Diagnosis not present

## 2018-03-21 DIAGNOSIS — Z713 Dietary counseling and surveillance: Secondary | ICD-10-CM | POA: Diagnosis present

## 2018-03-21 DIAGNOSIS — Z8249 Family history of ischemic heart disease and other diseases of the circulatory system: Secondary | ICD-10-CM | POA: Insufficient documentation

## 2018-03-21 DIAGNOSIS — E785 Hyperlipidemia, unspecified: Secondary | ICD-10-CM | POA: Diagnosis not present

## 2018-03-21 DIAGNOSIS — I10 Essential (primary) hypertension: Secondary | ICD-10-CM | POA: Insufficient documentation

## 2018-03-21 DIAGNOSIS — Z79899 Other long term (current) drug therapy: Secondary | ICD-10-CM | POA: Insufficient documentation

## 2018-03-21 DIAGNOSIS — Z7984 Long term (current) use of oral hypoglycemic drugs: Secondary | ICD-10-CM | POA: Diagnosis not present

## 2018-03-21 DIAGNOSIS — E669 Obesity, unspecified: Secondary | ICD-10-CM

## 2018-03-21 NOTE — Progress Notes (Signed)
Pre-Operative Sleeve Gastrecomty Surgery  Medical Nutrition Therapy:  Appt start time: 8:50  End time:  9:50  Surgeon wants pt to lose 30-40 pounds before surgery. Pt works alternative shifts between 2nd and 3rd.  Pt is 19 lbs to his goal of 30 pounds lost before surgery.  Pt arrives having lost about 3 pounds. Pt states he has been proud of himself for eating smaller portions and feeling good about it. Pt states he wishes he had more changes faster. Pt states he has noticed it has been Getting easier to make these changes and eat healthy.  Pt states he wants to work on getting more exercise in for the next month and eating throughout the day on his days off and drinking more water. Work on focusing on the victories you have had.   Start weight at [facility]: 456 Weight: 437 BMI: 64.53  24 hr Dietary Recall: skipping breakfast 3 days a week First Meal: fruit and oatmeal sometimes cereal sometimes biscuit from fast food Snack: N/A Second Meal: chicken in salad or grilled chicken wrap Snack: tuna and crackers  Third Meal: fish with squash and zucchini or broccoli  Snack: N/A Beverages: water, every now and then 12 ounce can of soda   Goals: -Aim to be active for 150 minutes a week -Aim for 3 meals a day and a maximum of 2 snacks; paying very close attention to how often you are eating  -Aim for 10 minutes of walking a day  -Continue to put together balanced meals -Follow diet recommendations listed below   Physical Activity: walking 2-3 days a week  Energy and Macronutrient Recomendations: Calories: 1600 Carbohydrate: 180 Protein: 120 Fat: 44  Demonstrated degree of understanding via:  Teach Back   Teaching Method Utilized:  Visual Auditory Hands on  Barriers to learning/adherence to lifestyle change: work schedule/contempplative stage of change   Patient to call the [facility] to enroll in Pre-Op and Post-Op Nutrition Education when surgery  date is scheduled.

## 2018-05-30 ENCOUNTER — Telehealth: Payer: Self-pay | Admitting: Cardiology

## 2018-05-30 NOTE — Telephone Encounter (Signed)
Numerous attempts to contact patient with recall letters. Unable to reach by telephone. with no success.    [[id number]] 07/15/2017 4:00 PM New [10]    [System] 10/11/2017 11:02 PM Notification Sent [20]    11/30/2017 4:47 PM Notification Sent [20]    05/30/2018 1:08 PM Notification Sent [20]

## 2018-06-16 ENCOUNTER — Encounter: Payer: 59 | Attending: General Surgery | Admitting: Skilled Nursing Facility1

## 2018-06-16 DIAGNOSIS — Z8261 Family history of arthritis: Secondary | ICD-10-CM | POA: Insufficient documentation

## 2018-06-16 DIAGNOSIS — Z8249 Family history of ischemic heart disease and other diseases of the circulatory system: Secondary | ICD-10-CM | POA: Insufficient documentation

## 2018-06-16 DIAGNOSIS — Z7984 Long term (current) use of oral hypoglycemic drugs: Secondary | ICD-10-CM | POA: Insufficient documentation

## 2018-06-16 DIAGNOSIS — M5442 Lumbago with sciatica, left side: Secondary | ICD-10-CM | POA: Diagnosis not present

## 2018-06-16 DIAGNOSIS — E119 Type 2 diabetes mellitus without complications: Secondary | ICD-10-CM | POA: Diagnosis not present

## 2018-06-16 DIAGNOSIS — E785 Hyperlipidemia, unspecified: Secondary | ICD-10-CM | POA: Diagnosis not present

## 2018-06-16 DIAGNOSIS — M5441 Lumbago with sciatica, right side: Secondary | ICD-10-CM | POA: Insufficient documentation

## 2018-06-16 DIAGNOSIS — I10 Essential (primary) hypertension: Secondary | ICD-10-CM | POA: Insufficient documentation

## 2018-06-16 DIAGNOSIS — E669 Obesity, unspecified: Secondary | ICD-10-CM

## 2018-06-16 DIAGNOSIS — Z79899 Other long term (current) drug therapy: Secondary | ICD-10-CM | POA: Diagnosis not present

## 2018-06-16 DIAGNOSIS — Z713 Dietary counseling and surveillance: Secondary | ICD-10-CM | POA: Insufficient documentation

## 2018-06-16 DIAGNOSIS — G4733 Obstructive sleep apnea (adult) (pediatric): Secondary | ICD-10-CM | POA: Diagnosis not present

## 2018-06-16 DIAGNOSIS — Z6841 Body Mass Index (BMI) 40.0 and over, adult: Secondary | ICD-10-CM | POA: Diagnosis not present

## 2018-06-16 NOTE — Progress Notes (Signed)
Pre-Operative Sleeve Gastrecomty Surgery  Medical Nutrition Therapy:  Appt start time: 8:50  End time:  9:50  Surgeon wants pt to lose 30-40 pounds before surgery. Pt works alternative shifts between 2nd and 3rd.   Pt states he has been working a lot and not paying attention as close to his lifestyle changes but trying to make healthy choices. Pt states he started drinking mountain dews again and realized he did not want to pick that behavior back up. Pt states he has been working on cutting back on his red meats from 24 ounce to 6 ounces at a time and trying not toe at fast food.   Start weight at [facility]: 456 Weight: 439 BMI: 64.83  24 hr Dietary Recall: skipping breakfast 3 days a week First Meal: fruit and oatmeal sometimes cereal sometimes biscuit from fast food Snack: N/A Second Meal: chicken in salad or grilled chicken wrap Snack: tuna and crackers  Third Meal: fish with squash and zucchini or broccoli  Snack: N/A Beverages: water, every now and then 12 ounce can of soda   Goals: -Aim to be active for 150 minutes a week -Aim for 3 meals a day and a maximum of 2 snacks; paying very close attention to how often you are eating  -Aim for 10 minutes of walking a day  -Continue to put together balanced meals   Physical Activity: walking 2-3 days a week  Energy and Macronutrient Recomendations: Calories: 1600 Carbohydrate: 180 Protein: 120 Fat: 44  Demonstrated degree of understanding via:  Teach Back   Teaching Method Utilized:  Visual Auditory Hands on  Barriers to learning/adherence to lifestyle change: work schedule/contempplative stage of change

## 2018-06-30 ENCOUNTER — Other Ambulatory Visit: Payer: Self-pay

## 2018-06-30 ENCOUNTER — Encounter: Payer: 59 | Admitting: Skilled Nursing Facility1

## 2018-06-30 DIAGNOSIS — E669 Obesity, unspecified: Secondary | ICD-10-CM

## 2018-06-30 DIAGNOSIS — Z713 Dietary counseling and surveillance: Secondary | ICD-10-CM | POA: Diagnosis not present

## 2018-06-30 NOTE — Progress Notes (Signed)
Pre-Operative Sleeve Gastrecomty Surgery  Medical Nutrition Therapy:  Appt start time: 8:50  End time:  9:50  Surgeon wants pt to lose 30-40 pounds before surgery. Pt works alternative shifts between 2nd and 3rd.   Pt arrives having lost 2 pounds. Pt is trying to understand the relationship between his health and food choices. Pt states her brother in law recently died.  Pt is struggling with making changes but actively working on it.    Start weight at [facility]: 456 Weight: 437 BMI: 64.83  24 hr Dietary Recall: skipping breakfast 3 days a week First Meal: fruit and oatmeal sometimes cereal sometimes biscuit from fast food or skipped Snack: N/A Second Meal: chicken in salad or grilled chicken wrap Snack: tuna and crackers  Third Meal: fish with squash and zucchini or broccoli  Snack: N/A Beverages: water, every now and then 12 ounce can of soda   Goals: -Aim to be active for 150 minutes a week -Aim for 3 meals a day and a maximum of 2 snacks; paying very close attention to how often you are eating  -Aim for 10 minutes of walking a day  -Keep a notepad of everything you are eating and drinking   Physical Activity: walking 2-3 days a week  Energy and Macronutrient Recomendations: Calories: 1600 Carbohydrate: 180 Protein: 120 Fat: 44  Demonstrated degree of understanding via:  Teach Back   Teaching Method Utilized:  Visual Auditory Hands on  Barriers to learning/adherence to lifestyle change: work schedule/contempplative stage of change

## 2018-07-14 ENCOUNTER — Encounter: Payer: 59 | Attending: General Surgery | Admitting: Skilled Nursing Facility1

## 2018-07-14 ENCOUNTER — Other Ambulatory Visit: Payer: Self-pay

## 2018-07-14 DIAGNOSIS — M5442 Lumbago with sciatica, left side: Secondary | ICD-10-CM | POA: Diagnosis not present

## 2018-07-14 DIAGNOSIS — M5441 Lumbago with sciatica, right side: Secondary | ICD-10-CM | POA: Insufficient documentation

## 2018-07-14 DIAGNOSIS — Z79899 Other long term (current) drug therapy: Secondary | ICD-10-CM | POA: Insufficient documentation

## 2018-07-14 DIAGNOSIS — Z6841 Body Mass Index (BMI) 40.0 and over, adult: Secondary | ICD-10-CM | POA: Diagnosis not present

## 2018-07-14 DIAGNOSIS — E669 Obesity, unspecified: Secondary | ICD-10-CM

## 2018-07-14 DIAGNOSIS — E119 Type 2 diabetes mellitus without complications: Secondary | ICD-10-CM | POA: Insufficient documentation

## 2018-07-14 DIAGNOSIS — Z8261 Family history of arthritis: Secondary | ICD-10-CM | POA: Insufficient documentation

## 2018-07-14 DIAGNOSIS — E785 Hyperlipidemia, unspecified: Secondary | ICD-10-CM | POA: Diagnosis not present

## 2018-07-14 DIAGNOSIS — I10 Essential (primary) hypertension: Secondary | ICD-10-CM | POA: Diagnosis not present

## 2018-07-14 DIAGNOSIS — G4733 Obstructive sleep apnea (adult) (pediatric): Secondary | ICD-10-CM | POA: Insufficient documentation

## 2018-07-14 DIAGNOSIS — Z7984 Long term (current) use of oral hypoglycemic drugs: Secondary | ICD-10-CM | POA: Diagnosis not present

## 2018-07-14 DIAGNOSIS — Z713 Dietary counseling and surveillance: Secondary | ICD-10-CM | POA: Insufficient documentation

## 2018-07-14 DIAGNOSIS — Z8249 Family history of ischemic heart disease and other diseases of the circulatory system: Secondary | ICD-10-CM | POA: Diagnosis not present

## 2018-07-14 NOTE — Progress Notes (Signed)
Pre-Operative Sleeve Gastrecomty Surgery  Medical Nutrition Therapy:  Appt start time: 8:50  End time:  9:50  Surgeon wants pt to lose 30-40 pounds before surgery. Pt works alternative shifts between 2nd and 3rd.   Pt is struggling with making changes but actively working on it.    Pt arrives having gained about 2 pounds. Pt states his work has not slowed down [employer]). Pt states all this corona stuff is causing him trouble with sleep and focusing on his health.    Start weight at [facility]: 456 Weight: 438.9 BMI: 64.81   24 hr Dietary Recall: skipping breakfast 3 days a week First Meal: fruit and oatmeal sometimes cereal sometimes biscuit from fast food or skipped Snack: N/A Second Meal: chicken in salad or grilled chicken wrap Snack: tuna and crackers  Third Meal: fish with squash and zucchini or broccoli  Snack: N/A Beverages: water, every now and then 12 ounce can of soda   Goals: -Aim to be active for 150 minutes a week -Aim for 3 meals a day and a maximum of 2 snacks; paying very close attention to how often you are eating  -Aim for 10 minutes of walking a day  -Try using the picture apps to log what you are eating and drinking  Physical Activity: walking 2-3 days a week  Energy and Macronutrient Recomendations: Calories: 1600 Carbohydrate: 180 Protein: 120 Fat: 44  Demonstrated degree of understanding via:  Teach Back   Teaching Method Utilized:  Visual Auditory Hands on  Barriers to learning/adherence to lifestyle change: work schedule/contempplative stage of change

## 2018-08-16 ENCOUNTER — Other Ambulatory Visit: Payer: Self-pay

## 2018-08-16 ENCOUNTER — Encounter: Payer: 59 | Attending: General Surgery | Admitting: Skilled Nursing Facility1

## 2018-08-16 DIAGNOSIS — Z8261 Family history of arthritis: Secondary | ICD-10-CM | POA: Diagnosis not present

## 2018-08-16 DIAGNOSIS — E785 Hyperlipidemia, unspecified: Secondary | ICD-10-CM | POA: Insufficient documentation

## 2018-08-16 DIAGNOSIS — Z7984 Long term (current) use of oral hypoglycemic drugs: Secondary | ICD-10-CM | POA: Insufficient documentation

## 2018-08-16 DIAGNOSIS — E669 Obesity, unspecified: Secondary | ICD-10-CM

## 2018-08-16 DIAGNOSIS — I10 Essential (primary) hypertension: Secondary | ICD-10-CM | POA: Diagnosis not present

## 2018-08-16 DIAGNOSIS — Z713 Dietary counseling and surveillance: Secondary | ICD-10-CM | POA: Diagnosis present

## 2018-08-16 DIAGNOSIS — Z6841 Body Mass Index (BMI) 40.0 and over, adult: Secondary | ICD-10-CM | POA: Diagnosis not present

## 2018-08-16 DIAGNOSIS — Z79899 Other long term (current) drug therapy: Secondary | ICD-10-CM | POA: Insufficient documentation

## 2018-08-16 DIAGNOSIS — E119 Type 2 diabetes mellitus without complications: Secondary | ICD-10-CM | POA: Insufficient documentation

## 2018-08-16 DIAGNOSIS — M5442 Lumbago with sciatica, left side: Secondary | ICD-10-CM | POA: Insufficient documentation

## 2018-08-16 DIAGNOSIS — M5441 Lumbago with sciatica, right side: Secondary | ICD-10-CM | POA: Diagnosis not present

## 2018-08-16 DIAGNOSIS — G4733 Obstructive sleep apnea (adult) (pediatric): Secondary | ICD-10-CM | POA: Diagnosis not present

## 2018-08-16 DIAGNOSIS — Z8249 Family history of ischemic heart disease and other diseases of the circulatory system: Secondary | ICD-10-CM | POA: Diagnosis not present

## 2018-08-16 NOTE — Progress Notes (Signed)
Pre-Operative Sleeve Gastrecomty Surgery  Medical Nutrition Therapy:  Appt start time: 8:50  End time:  9:50  Surgeon wants pt to lose 30-40 pounds before surgery. Pt works alternative shifts between 2nd and 3rd.   Pt is struggling with making changes but actively working on it.    Pt arrives having gained about 2 pounds. Pt states his work has not slowed down [employer]). Pt states all this corona stuff is causing him trouble with sleep and focusing on his health.    Pt arrives having gained about 2.4. Pt states he has been working a lot of over time due to employees being taken out of work due to [employer]. Pt states he is also in the middle of trying to move and needs to make time to sleep. Pt states he is now scared of getting Covid19 because so many people he works with has it with a  Few deaths. 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 Pt states he got back into drinking soda. Pt states he has done some walking every day.  Start weight at [facility]: 456 Weight: 441.3 BMI: 65.17  24 hr Dietary Recall: skipping breakfast 3 days a week First Meal: fruit and oatmeal sometimes cereal sometimes biscuit from fast food or skipped Snack: N/A Second Meal: chicken in salad or grilled chicken wrap Snack: tuna and crackers  Third Meal: fish with squash and zucchini or broccoli  Snack: N/A Beverages: water, every now and then 12 ounce can of soda   Goals: -Aim to be active for 150 minutes a week -Keep up your daily walks, [employer] Job! -Do not drink any soda; make cold water every day to drink instead   Physical Activity: walking 7 days a week 30 minutes   Energy and Macronutrient Recomendations: Calories: 1600 Carbohydrate: 180 Protein: 120 Fat: 44  Demonstrated degree of understanding via:  Teach Back   Teaching Method Utilized:  Visual Auditory Hands on  Barriers to learning/adherence to lifestyle change: work schedule/contempplative  stage of change

## 2018-09-21 ENCOUNTER — Encounter: Payer: 59 | Attending: General Surgery | Admitting: Skilled Nursing Facility1

## 2018-09-21 ENCOUNTER — Other Ambulatory Visit: Payer: Self-pay

## 2018-09-21 DIAGNOSIS — Z8249 Family history of ischemic heart disease and other diseases of the circulatory system: Secondary | ICD-10-CM | POA: Diagnosis not present

## 2018-09-21 DIAGNOSIS — Z7984 Long term (current) use of oral hypoglycemic drugs: Secondary | ICD-10-CM | POA: Diagnosis not present

## 2018-09-21 DIAGNOSIS — M5441 Lumbago with sciatica, right side: Secondary | ICD-10-CM | POA: Insufficient documentation

## 2018-09-21 DIAGNOSIS — E785 Hyperlipidemia, unspecified: Secondary | ICD-10-CM | POA: Insufficient documentation

## 2018-09-21 DIAGNOSIS — E119 Type 2 diabetes mellitus without complications: Secondary | ICD-10-CM | POA: Insufficient documentation

## 2018-09-21 DIAGNOSIS — M5442 Lumbago with sciatica, left side: Secondary | ICD-10-CM | POA: Insufficient documentation

## 2018-09-21 DIAGNOSIS — Z79899 Other long term (current) drug therapy: Secondary | ICD-10-CM | POA: Diagnosis not present

## 2018-09-21 DIAGNOSIS — Z8261 Family history of arthritis: Secondary | ICD-10-CM | POA: Insufficient documentation

## 2018-09-21 DIAGNOSIS — Z6841 Body Mass Index (BMI) 40.0 and over, adult: Secondary | ICD-10-CM | POA: Diagnosis not present

## 2018-09-21 DIAGNOSIS — E669 Obesity, unspecified: Secondary | ICD-10-CM

## 2018-09-21 DIAGNOSIS — I10 Essential (primary) hypertension: Secondary | ICD-10-CM | POA: Diagnosis not present

## 2018-09-21 DIAGNOSIS — G4733 Obstructive sleep apnea (adult) (pediatric): Secondary | ICD-10-CM | POA: Insufficient documentation

## 2018-09-21 DIAGNOSIS — Z713 Dietary counseling and surveillance: Secondary | ICD-10-CM | POA: Insufficient documentation

## 2018-09-21 NOTE — Progress Notes (Signed)
Pre-Operative Sleeve Gastrecomty Surgery  Medical Nutrition Therapy:  Appt start time: 8:50  End time:  9:50  Surgeon wants pt to lose 30-40 pounds before surgery. Pt works alternative shifts between 2nd and 3rd.   Pt is struggling with making changes but actively working on it.    Pt arrives having lost about 1 pound. Pt states his wife had a mini stroke (she has DM). Pt states he might pay for a meal prep plan. Pt states there has been too much going on for him to focus on any lifestyle changes. Pt states he has been drinking a lot of water still. Pt states his blood pressure and blood sugars have been good.   Start weight at [facility]: 456 Weight: 440 BMI: 64.98  24 hr Dietary Recall: skipping breakfast 3 days a week First Meal: cereal at work Snack: N/A Second Meal: chicken in salad or grilled chicken wrap Snack: tuna and crackers  Third Meal: fish with squash and zucchini or broccoli  Snack: N/A Beverages: water, every now and then 12 ounce can of soda   Goals: -Aim to be active for 150 minutes a week -Keep up your daily walks, [employer] Job! -Do not drink any soda; make cold water every day to drink instead  -Look into the buying the chef made meals -Talk with your wife about what you can help her with while not compromising your sleep Physical Activity: walking 7 days a week 30 minutes   Energy and Macronutrient Recomendations: Calories: 1600 Carbohydrate: 180 Protein: 120 Fat: 44  Demonstrated degree of understanding via:  Teach Back   Teaching Method Utilized:  Visual Auditory Hands on  Barriers to learning/adherence to lifestyle change: work schedule/contempplative stage of change

## 2018-10-20 ENCOUNTER — Ambulatory Visit: Payer: 59 | Admitting: Skilled Nursing Facility1

## 2020-10-26 ENCOUNTER — Encounter: Payer: Self-pay | Admitting: Cardiology

## 2020-11-07 ENCOUNTER — Encounter: Payer: Self-pay | Admitting: Internal Medicine

## 2021-03-10 NOTE — Progress Notes (Signed)
Primary Care Physician:  Neale Burly, MD  Primary Gastroenterologist:  Elon Alas. Abbey Chatters, DO   Chief Complaint  Patient presents with   Colonoscopy    Never had tcs. No fhcrc. No problems    HPI:  Edgar Myers. is a 50 y.o. male here at the request of Dr. Sherrie Sport for screening colonoscopy. Due to patient's morbid obesity, he was brought in for evaluation prior to procedure.   He has never had a colonoscopy.  No family history of colon cancer.  Denies any bowel concerns.  No blood in the stool or melena.  Occasional heartburn with certain foods.  No dysphagia or vomiting.  He has sleep apnea, uses CPAP intermittently.  States he was seen by cardiology in 2019 as part of evaluation for weight loss surgery.  States he was cleared for surgery but due to [place] he never completed the surgery.  Patient was seen in the ED at [facility] on November 22.  He has had several month history of intermittent left upper chest pain, described as dull and achy.  Sometimes associated with pain in the left arm as well as the left neck.  Chronically has dyspnea on exertion due to his size for patient.  Denies any exertional chest pain.  He has felt that his pain has been musculoskeletal in nature although it felt different last week so he went to the ED to rule out cardiac source.  ED report stated EKG was normal with normal sinus rhythm.  Chest x-ray with no active disease.  CTA chest was limited due to suboptimal contrast bolus, extensive patient respiratory motion and image noise related to patient's large body habitus.  No definite PE seen.  He has severe hepatic steatosis.  Two-vessel coronary artery disease with calcified atherosclerotic plaque in the left anterior descending and right coronary arteries.  Patient is supposed to call and make an appointment for follow-up.  Patient seen in 2019 by cardiology, Dr. Harl Bowie.  Per note, patient previously reported negative stress test at [facility].   Echocardiogram March 2019 EF of 55 to 60%.  Wall thickness was increased in a pattern of moderate LVH.  Overall Dr. Harl Bowie felt echocardiogram looked good.    Current Outpatient Medications  Medication Sig Dispense Refill   BYSTOLIC 10 MG tablet 10 mg daily.  0   furosemide (LASIX) 40 MG tablet Take 1 tablet (40 mg total) by mouth daily. (Patient taking differently: Take 40 mg by mouth every other day.) 90 tablet 1   metFORMIN (GLUCOPHAGE) 500 MG tablet Take by mouth 2 (two) times daily with a meal.     olmesartan (BENICAR) 40 MG tablet Take 40 mg by mouth daily.     simvastatin (ZOCOR) 10 MG tablet Take 10 mg by mouth daily.     benazepril (LOTENSIN) 20 MG tablet Take 1 tablet (20 mg total) by mouth daily. (Patient not taking: Reported on 03/11/2021) 90 tablet 1   No current facility-administered medications for this visit.    Allergies as of 03/11/2021   (No Known Allergies)    Past Medical History:  Diagnosis Date   Diabetes mellitus without complication ([place])    pre-diabetic   Hypertension    Morbid obesity ([place])    Sleep apnea     Past Surgical History:  Procedure Laterality Date   KNEE ARTHROSCOPY      Family History  Problem Relation Age of Onset   Colon cancer Neg Hx     Social History  Socioeconomic History   Marital status: Single    Spouse name: Not on file   Number of children: Not on file   Years of education: Not on file   Highest education level: Not on file  Occupational History   Not on file  Tobacco Use   Smoking status: Never   Smokeless tobacco: Never  Substance and Sexual Activity   Alcohol use: Yes    Comment: very little   Drug use: No   Sexual activity: Not on file  Other Topics Concern   Not on file  Social History Narrative   Not on file   Social Determinants of Health   Financial Resource Strain: Not on file  Food Insecurity: Not on file  Transportation Needs: Not on file  Physical Activity: Not on file  Stress: Not on  file  Social Connections: Not on file  Intimate Partner Violence: Not on file      ROS:  General: Negative for anorexia, weight loss, fever, chills, fatigue, weakness. Eyes: Negative for vision changes.  ENT: Negative for hoarseness, difficulty swallowing , nasal congestion. CV: Negative for chest pain, angina, palpitations, positive dyspnea on exertion, positive peripheral edema.  Respiratory: Negative for dyspnea at rest, cough, sputum, wheezing.  Positive DOE GI: See history of present illness. GU:  Negative for dysuria, hematuria, urinary incontinence, urinary frequency, nocturnal urination.  MS: Negative for joint pain, low back pain.  Derm: Negative for rash or itching.  Neuro: Negative for weakness, abnormal sensation, seizure, frequent headaches, memory loss, confusion.  Psych: Negative for anxiety, depression, suicidal ideation, hallucinations.  Endo: Negative for unusual weight change.  Heme: Negative for bruising or bleeding. Allergy: Negative for rash or hives.    Physical Examination:  BP (!) 142/88   Pulse 66   Temp 98.2 F (36.8 C) (Temporal)   Ht 5\' 9"  (1.753 m)   Wt (!) 468 lb 9.6 oz (212.6 kg)   BMI 69.20 kg/m    General: Pleasant morbidly obese male in no acute distress.  Head: Normocephalic, atraumatic.   Eyes: Conjunctiva pink, no icterus. Mouth: masked. Neck: Supple without thyromegaly, masses, or lymphadenopathy.  Lungs: Clear to auscultation bilaterally.  Heart: Regular rate and rhythm, no murmurs rubs or gallops.  Abdomen: Bowel sounds are normal, nontender, nondistended, no hepatosplenomegaly or masses, no abdominal bruits, no rebound or guarding.  Exam limited by body habitus.  Umbilical hernia easily reducible and nontender Rectal: Not performed Extremities: No lower extremity edema. No clubbing or deformities.  Neuro: Alert and oriented x 4 , grossly normal neurologically.  Skin: Warm and dry, no rash or jaundice.   Psych: Alert and  cooperative, normal mood and affect.  Labs: No results found for: CREATININE, BUN, NA, K, CL, CO2 03/04/21: WBC 7100, Hgb 15.7, Platelet 223000, cre 1.45, LFTs normal. Lipase 122H,   Imaging Studies: CTA chest 03/04/21: Impression:  1. Limited study demonstrating no central, lobar or proximal  segmental sized pulmonary embolism.  2. No acute findings are noted in the thorax to account for the  patient's symptoms.  3. Severe hepatic steatosis.  4. Aortic atherosclerosis, in addition to 2 vessel coronary artery  disease. Please note that although the presence of coronary artery  calcium documents the presence of coronary artery disease, the  severity of this disease and any potential stenosis cannot be  assessed on this non-gated CT examination. Assessment for potential  risk factor modification, dietary therapy or pharmacologic therapy  may be warranted, if clinically indicated.  Aortic Atherosclerosis (ICD10-I70.0).    Assessment/Plan:  Pleasant 50 year old morbidly obese male presenting to schedule first-ever colonoscopy for screening purposes.  No GI symptoms.  No family history of colon cancer.  Recently seen in the ED for left chest pain which he has been having off and on for few months.  EKG and troponin unremarkable.  CTA chest, limited study but no apparent PE, he did have aortic atherosclerosis in addition to two-vessel coronary artery disease.  Previous cardiology evaluation in 2019 as outlined above, preop clearance for weight loss surgery at that time.  Patient is supposed to follow-up with cardiology.  Discussed at length with him today, likely will need cardiac clearance prior to screening colonoscopy, will discuss with Dr. Abbey Chatters.  Patient to call let me know when he has an appointment set up with cardiology.

## 2021-03-11 ENCOUNTER — Other Ambulatory Visit: Payer: Self-pay

## 2021-03-11 ENCOUNTER — Ambulatory Visit: Payer: 59 | Admitting: Gastroenterology

## 2021-03-11 ENCOUNTER — Encounter: Payer: Self-pay | Admitting: Gastroenterology

## 2021-03-11 DIAGNOSIS — Z1211 Encounter for screening for malignant neoplasm of colon: Secondary | ICD-10-CM | POA: Diagnosis not present

## 2021-03-11 NOTE — Patient Instructions (Signed)
Please schedule your cardiology follow up appointment and call us with the date.  I will be in touch with anesthesiology to see if they will require you to see cardiology prior to a screening colonoscopy.

## 2021-03-12 ENCOUNTER — Telehealth: Payer: Self-pay | Admitting: Gastroenterology

## 2021-03-12 NOTE — Telephone Encounter (Signed)
Please let pt know that I spoke with Dr. Abbey Chatters and anesthesiology. He will have to see cardiology and be cleared before we can schedule a screening colonoscopy given his recent chest pain.   He needs to reestablish care with cardiology and we need to get cardiac clearance. Once we have that, we can schedule his colonoscopy.

## 2021-03-13 NOTE — Telephone Encounter (Signed)
Lmom for pt to return my call.  

## 2021-03-14 NOTE — Telephone Encounter (Signed)
Lmom for pt to return my call.  

## 2021-03-17 NOTE — Telephone Encounter (Signed)
Pt was made aware and verbalized understanding. Pt will contact us once he schedules and sees cardiology.

## 2021-05-06 ENCOUNTER — Other Ambulatory Visit: Payer: Self-pay | Admitting: General Surgery

## 2021-05-06 ENCOUNTER — Other Ambulatory Visit (HOSPITAL_COMMUNITY): Payer: Self-pay | Admitting: General Surgery

## 2021-05-06 DIAGNOSIS — R053 Chronic cough: Secondary | ICD-10-CM

## 2021-05-20 ENCOUNTER — Telehealth: Payer: Self-pay | Admitting: Internal Medicine

## 2021-05-20 NOTE — Telephone Encounter (Signed)
FYI: Edgar Myers  °

## 2021-05-20 NOTE — Telephone Encounter (Signed)
Patient called back. He is scheduled to see cardiology 06/05/21, Dr. Harl Bowie

## 2021-05-20 NOTE — Telephone Encounter (Signed)
Pt's wife called to let us know that patient has appointment with a cardiologist in [place] on 06/05/2021 at 0900. [phone]

## 2021-05-21 NOTE — Telephone Encounter (Signed)
See other telephone note.  

## 2021-05-21 NOTE — Telephone Encounter (Signed)
Noted  

## 2021-05-29 ENCOUNTER — Other Ambulatory Visit: Payer: Self-pay

## 2021-05-29 ENCOUNTER — Ambulatory Visit: Payer: 59 | Admitting: Pulmonary Disease

## 2021-05-29 ENCOUNTER — Encounter: Payer: Self-pay | Admitting: Pulmonary Disease

## 2021-05-29 VITALS — BP 132/74 | HR 78 | Temp 98.0°F | Ht 70.0 in | Wt >= 6400 oz

## 2021-05-29 DIAGNOSIS — R053 Chronic cough: Secondary | ICD-10-CM | POA: Diagnosis not present

## 2021-05-29 DIAGNOSIS — J45991 Cough variant asthma: Secondary | ICD-10-CM | POA: Diagnosis not present

## 2021-05-29 MED ORDER — FLUTICASONE-SALMETEROL 500-50 MCG/ACT IN AEPB: 1.0000 | INHALATION_SPRAY | Freq: Two times a day (BID) | RESPIRATORY_TRACT | 3 refills | Status: DC

## 2021-05-29 NOTE — Patient Instructions (Signed)
Nice to meet you  You have several risk factors for chronic cough.  Based on your story, I am most concerned for something like asthma following your COVID infection that is causing ongoing inflammation in the air tubes of the lungs and leading to cough.  Use Advair discus 1 puff twice a day, every day.  Rinse your mouth out after every use.  I recommend once you get the inhaler if you are able to going to YouTube and searching "how to use Advair discus".  This will show you how to use it and can be viewed in the future as reminders on how to use the inhaler.  If the inhaler is too expensive, please ask the pharmacist what the preferred medicine in this category would be based on your insurance plan.  If they cannot help you, please contact our office for further advice.  Use the inhaler for at least 30 days.  If the cough is not improving, please call the office or send me a MyChart message and we will see if we can change some medications to further help the cough before you follow-up with me.  Return to clinic in 3 months or sooner if needed with Dr. Silas Flood

## 2021-05-29 NOTE — Progress Notes (Signed)
@Patient  ID: Edgar Myers., male    DOB: 11-27-1970, 51 y.o.   MRN: [id number]  Chief Complaint  Patient presents with   Consult    Consult for cough. Pt states he has had COVID twice before and the cough has just lingered since then. Covid was in 2019. Pt states that the cough is wet. Not a lot of mucus production noted.     Referring provider: Greer Pickerel, MD  HPI:   51 y.o. man whom we are seeing in consultation for evaluation of chronic cough.  Note from referring provider reviewed.  Patient notes cough for 2 to 3 years.  Started after COVID infection.  Worse in the mornings, and then night after he falls asleep.  Sometimes productive, often dry.  Cough feels wet but often nothing comes up.  Nothing really helps the cough.  He does not think seasonal environmental changes have affected the cough in any way.  Cough is not worsening, largely the same.  No position that makes things better or worse.  He does note subsequent COVID infection in the interim where he had prolonged worsening cough but this gradually subsided.  Also associate with dyspnea on exertion that time it has gradually subsided.  He does have baseline dyspnea he attributes to his weight.  No worsening overall.  He endorses a history of seasonal allergies.  Runny nose, sinus congestion.  Comes and goes.  Also does endorse reflux symptoms.  No daily medication for this.  Uses over-the-counter Tums etc. 1-2 times a month.  Reviewed results of chest x-ray 03/04/2021 that revealed clear lungs as well as results of CTA PE protocol same day results of which report normal and clear parenchyma, no PE.  PMH: Hypertension, diabetes, hyperlipidemia, seasonal allergies, sleep apnea Surgical history: Knee surgery Family history: Allergies run in family Social history: Never smoker, lives in [place], works at [employer] / Pulmonary Flowsheets:   ACT:  No flowsheet data found.  MMRC: No flowsheet  data found.  Epworth:  No flowsheet data found.  Tests:   FENO:  No results found for: NITRICOXIDE  PFT: No flowsheet data found.  WALK:  No flowsheet data found.  Imaging: Personally reviewed and as per EMR No results found.  Lab Results: Lab results 03/04/2021 via [facility] reviewed, no anemia, notable for CKD stage IIIa with creatinine 1.45, elevated bicarbonate suggestive of chronic CO2 retention CBC No results found for: WBC, RBC, HGB, HCT, PLT, MCV, MCH, MCHC, RDW, LYMPHSABS, MONOABS, EOSABS, BASOSABS  BMET No results found for: NA, K, CL, CO2, GLUCOSE, BUN, CREATININE, CALCIUM, GFRNONAA, GFRAA  BNP No results found for: BNP  ProBNP No results found for: PROBNP  Specialty Problems       Pulmonary Problems   SOB (shortness of breath)    No Known Allergies   There is no immunization history on file for this patient.  Past Medical History:  Diagnosis Date   Diabetes mellitus without complication ([place])    pre-diabetic   Hypertension    Morbid obesity ([place])    Sleep apnea     Tobacco History: Social History   Tobacco Use  Smoking Status Never  Smokeless Tobacco Never   Counseling given: Not Answered   Continue to not smoke  Outpatient Encounter Medications as of 05/29/2021  Medication Sig   BYSTOLIC 10 MG tablet 10 mg daily.   fluticasone-salmeterol (ADVAIR DISKUS) 500-50 MCG/ACT AEPB Inhale 1 puff into the lungs in the  morning and at bedtime.   metFORMIN (GLUCOPHAGE) 500 MG tablet Take by mouth 2 (two) times daily with a meal.   olmesartan (BENICAR) 40 MG tablet Take 40 mg by mouth daily.   simvastatin (ZOCOR) 10 MG tablet Take 10 mg by mouth daily.   [DISCONTINUED] benazepril (LOTENSIN) 20 MG tablet Take 1 tablet (20 mg total) by mouth daily.   furosemide (LASIX) 40 MG tablet Take 1 tablet (40 mg total) by mouth daily. (Patient taking differently: Take 40 mg by mouth every other day.)   No facility-administered encounter  medications on file as of 05/29/2021.     Review of Systems  Review of Systems  No chest pain with exertion.  No orthopnea or PND.  No worsening lower extremity swelling.  Comprehensive review of systems otherwise negative. Physical Exam  BP 132/74 (BP Location: Left Arm, Patient Position: Sitting, Cuff Size: Normal)    Pulse 78    Temp 98 F (36.7 C) (Oral)    Ht 5\' 10"  (1.778 m)    Wt (!) 464 lb (210.5 kg)    SpO2 96%    BMI 66.58 kg/m   Wt Readings from Last 5 Encounters:  05/29/21 (!) 464 lb (210.5 kg)  03/11/21 (!) 468 lb 9.6 oz (212.6 kg)  09/21/18 (!) 440 lb (199.6 kg)  08/16/18 (!) 441 lb 4.8 oz (200.2 kg)  07/14/18 (!) 438 lb 14.4 oz (199.1 kg)    BMI Readings from Last 5 Encounters:  05/29/21 66.58 kg/m  03/11/21 69.20 kg/m  09/21/18 64.98 kg/m  08/16/18 65.17 kg/m  07/14/18 64.81 kg/m     Physical Exam General: Obese, sitting in exam chair Eyes: EOMI, icterus Neck: Supple, no JVP Pulmonary: Clear, no wheeze Cardiovascular regular in rhythm, no murmur Abdomen: Nondistended, bowel sounds present MSK: No synovitis, joint effusion Neuro: Normal gait, no weakness Psych: Normal mood, full affect   Assessment & Plan:   Chronic cough: Present for 2 to 3 years.  Following COVID infection.  Prolonged cough worsened after second COVID infection.  Sounds like bronchitis-like symptoms.  He has multiple risk factors for chronic cough including intermittent postnasal drip, and intermittent GERD symptoms.  However given description of cough and underlying atopic symptoms, most concerning for asthma.  Chest imaging 02/2021 with clear lungs based on chest x-ray and CT scan report personally reviewed.  No change in quality or quantity of cough in the interim.  No further chest imaging today.  Asthma: Based on atopic symptoms, worsened cough after viral infection.  Advair high-dose Diskus 1 puff twice a day.  Assess response.   Return in about 3 months (around  08/26/2021).   [name], MD 05/29/2021

## 2021-06-04 ENCOUNTER — Encounter: Payer: Self-pay | Admitting: Cardiology

## 2021-06-05 ENCOUNTER — Encounter: Payer: Self-pay | Admitting: Cardiology

## 2021-06-05 ENCOUNTER — Ambulatory Visit: Payer: 59 | Admitting: Cardiology

## 2021-06-05 VITALS — BP 130/78 | HR 75 | Ht 70.0 in | Wt >= 6400 oz

## 2021-06-05 DIAGNOSIS — R0602 Shortness of breath: Secondary | ICD-10-CM | POA: Diagnosis not present

## 2021-06-05 DIAGNOSIS — I2510 Atherosclerotic heart disease of native coronary artery without angina pectoris: Secondary | ICD-10-CM

## 2021-06-05 DIAGNOSIS — Z01810 Encounter for preprocedural cardiovascular examination: Secondary | ICD-10-CM

## 2021-06-05 DIAGNOSIS — I2583 Coronary atherosclerosis due to lipid rich plaque: Secondary | ICD-10-CM | POA: Diagnosis not present

## 2021-06-05 MED ORDER — ASPIRIN EC 81 MG PO TBEC: 81.0000 mg | DELAYED_RELEASE_TABLET | Freq: Every day | ORAL | 3 refills | Status: DC

## 2021-06-05 NOTE — Patient Instructions (Signed)
Medication Instructions:  Your physician has recommended you make the following change in your medication:  Start aspirin 81 mg once a day Continue medications as directed  Labwork: none  Testing/Procedures: Your physician has requested that you have an echocardiogram. Echocardiography is a painless test that uses sound waves to create images of your heart. It provides your doctor with information about the size and shape of your heart and how well your hearts chambers and valves are working. This procedure takes approximately one hour. There are no restrictions for this procedure.   Follow-Up: Your physician recommends that you schedule a follow-up appointment in: Follow up pending results of echocardiogram  Any Other Special Instructions Will Be Listed Below (If Applicable).  If you need a refill on your cardiac medications before your next appointment, please call your pharmacy.

## 2021-06-05 NOTE — Progress Notes (Signed)
Clinical Summary Edgar Myers is a 51 y.o.male seen today as a new patient for the following medical problems. Last seen in our clinic in 2013  1. Preoperative evaluation - being considered for bariatric surgery - denies any chest pain. 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  - at work carries heavy sacks of power, walking up and down flights of stairs at work sometimes carrying up to 50 lbs. Can walk up 3 flights of stairs without chest pain, some SOB but does not have to stop - some ongoing LE that is chronic though some progression     2. DM2 - followed by pcp   3. OSA   4. Coronary atherosclerosis - 02/2021 ER visit with chest pain. Trop neg, BNP 34 - 02/2021 CTA with coronary atherosclerosis, involving LAD and RCA  - no recurrent symptoms since that episode. Pain was atypicla in that it was constant for a few days.  - reports he does heavy lifting at work, may have triggered discomfort.  - COVID x 2, last epsidoe 6 months ago. Since then some DOE with activities.      5. LE edema - chronic but some progression - 02/2021 BNP during ER visit was in the 54s   Past Medical History:  Diagnosis Date   Diabetes mellitus without complication (HCC)    pre-diabetic   Gout    Hypertension    Morbid obesity ([place])    Sleep apnea    Umbilical hernia      No Known Allergies   Current Outpatient Medications  Medication Sig Dispense Refill   BYSTOLIC 10 MG tablet 10 mg daily.  0   fluticasone-salmeterol (ADVAIR DISKUS) 500-50 MCG/ACT AEPB Inhale 1 puff into the lungs in the morning and at bedtime. 60 each 3   furosemide (LASIX) 40 MG tablet Take 1 tablet (40 mg total) by mouth daily. (Patient taking differently: Take 40 mg by mouth every other day.) 90 tablet 1   metFORMIN (GLUCOPHAGE) 500 MG tablet Take by mouth 2 (two) times daily with a meal.     olmesartan (BENICAR) 40 MG tablet Take 40 mg by mouth daily.     simvastatin (ZOCOR) 10  MG tablet Take 10 mg by mouth daily.     No current facility-administered medications for this visit.     Past Surgical History:  Procedure Laterality Date   KNEE ARTHROSCOPY       No Known Allergies    Family History  Problem Relation Age of Onset   Hyperlipidemia Mother    Hypertension Father    Colon cancer Neg Hx      Social History Mr. Stapel reports that he has never smoked. He has never used smokeless tobacco. Mr. Hinderliter reports current alcohol use.   Review of Systems CONSTITUTIONAL: No weight loss, fever, chills, weakness or fatigue.  HEENT: Eyes: No visual loss, blurred vision, double vision or yellow sclerae.No hearing loss, sneezing, congestion, runny nose or sore throat.  SKIN: No rash or itching.  CARDIOVASCULAR: per hpi RESPIRATORY: No shortness of breath, cough or sputum.  GASTROINTESTINAL: No anorexia, nausea, vomiting or diarrhea. No abdominal pain or blood.  GENITOURINARY: No burning on urination, no polyuria NEUROLOGICAL: No headache, dizziness, syncope, paralysis, ataxia, numbness or tingling in the extremities. No change in bowel or bladder control.  MUSCULOSKELETAL: No muscle, back pain, joint pain or stiffness.  LYMPHATICS: No enlarged nodes. No history of splenectomy.  PSYCHIATRIC: No history of  depression or anxiety.  ENDOCRINOLOGIC: No reports of sweating, cold or heat intolerance. No polyuria or polydipsia.  Marland Kitchen   Physical Examination Today's Vitals   06/05/21 0900  BP: 130/78  Pulse: 75  SpO2: 94%  Weight: (!) 464 lb (210.5 kg)  Height: 5\' 10"  (1.778 m)   Body mass index is 66.58 kg/m.  Gen: resting comfortably, no acute distress HEENT: no scleral icterus, pupils equal round and reactive, no palptable cervical adenopathy,  CV: RRR, no m/r/ gno jvd Resp: Clear to auscultation bilaterally GI: abdomen is soft, non-tender, non-distended, normal bowel sounds, no hepatosplenomegaly MSK: extremities are warm, 1+ bilateral LE edema   Skin: warm, no rash Neuro:  no focal deficits Psych: appropriate affect   Diagnostic Studies 06/2017 echo Study Conclusions   - Procedure narrative: Transthoracic echocardiography. Image    quality was adequate. Intravenous contrast (Definity) was    administered.  - Left ventricle: The cavity size was normal. Wall thickness was    increased in a pattern of moderate LVH. Systolic function was    normal. The estimated ejection fraction was in the range of 55%    to 60%. Wall motion was normal; there were no regional wall    motion abnormalities. Left ventricular diastolic function    parameters were normal.     Assessment and Plan  1. Preoperative evaluation - patient being considered for bariatric surgery - tolerates well over 4 METs at work regularly without significant cardiopulmonary symptoms - some coronary atherosclerosis noted on recent CT scan. Not a candidate for noninvasive imaging due to BMI, if ever needed an ischemic evaluation would have to plan for cath. Given his high exercise capacity I do not see a reason cath at this time and would clear for surgery based on exercise capacity alone pending echo - with some increased LE edema repeat echo  2. Coroanry atherosclerosis - noted on 02/2021 CTA - no chest pain. Not a candidate for noninvasive stress testing given BMI, if ever symptomatic would have to have a cath.  - would manage risk factors at this time, start ASA 81mg  daily    F/u pending echo, if benign clear for surgery and f/u just as needed   Arnoldo Lenis, M.D.

## 2021-06-09 ENCOUNTER — Ambulatory Visit (HOSPITAL_COMMUNITY)
Admission: RE | Admit: 2021-06-09 | Discharge: 2021-06-09 | Disposition: A | Discharge status: Home / Self Care | Payer: 59 | Source: Ambulatory Visit | Attending: General Surgery | Admitting: General Surgery

## 2021-06-09 ENCOUNTER — Encounter: Payer: 59 | Attending: General Surgery | Admitting: Skilled Nursing Facility1

## 2021-06-09 ENCOUNTER — Encounter: Payer: Self-pay | Admitting: Skilled Nursing Facility1

## 2021-06-09 ENCOUNTER — Other Ambulatory Visit: Payer: Self-pay

## 2021-06-09 DIAGNOSIS — Z713 Dietary counseling and surveillance: Secondary | ICD-10-CM | POA: Diagnosis not present

## 2021-06-09 DIAGNOSIS — R053 Chronic cough: Secondary | ICD-10-CM | POA: Insufficient documentation

## 2021-06-09 DIAGNOSIS — Z6841 Body Mass Index (BMI) 40.0 and over, adult: Secondary | ICD-10-CM | POA: Diagnosis not present

## 2021-06-09 NOTE — Progress Notes (Signed)
Nutrition Assessment for Bariatric Surgery Medical Nutrition Therapy Appt Start Time: 1:59    End Time: 2:56  Patient was seen on 06/09/2021 for Pre-Operative Nutrition Assessment. Letter of approval faxed to [facility] Surgery bariatric surgery program coordinator on 06/09/2021.   Referral stated Supervised Weight Loss (SWL) visits needed: 0  Pt completed visits.   Pt has cleared nutrition requirements.    Planned surgery: sleeve gastrectomy  Pt expectation of surgery: to lose weight Pt expectation of dietitian: none identified     NUTRITION ASSESSMENT   Anthropometrics  Start weight at [facility]: 468.3 lbs (date: 06/09/2021)  Height: 69 in BMI: 69.11 kg/m2     Clinical  Medical hx: HTN, DM, gout Medications: see list  Labs: creatinine 1.45, lipase 122 Notable signs/symptoms: headaches multiple times a week Any previous deficiencies? No  Micronutrient Nutrition Focused Physical Exam: Hair: No issues observed Eyes: No issues observed Mouth: No issues observed Neck: No issues observed Nails: No issues observed Skin: No issues observed  Lifestyle & Dietary Hx  Pt states he takes the lasix every other day.  Pt states he has learned shrimp give shim a gouty flare and beer so he avoids them.  Pt states he does not check his blood sugar.  Pt states his wife is supportive.   24-Hr Dietary Recall First Meal: skipped or egg sandwich + bacon or cereal or pancake + egg + sasuage Snack:  Second Meal: chicken leg or salad Snack:  Third Meal: chicken + rice Snack: ice cream  Beverages: pepsi, sweet tea, water   Estimated Energy Needs Calories: 1800   NUTRITION DIAGNOSIS  Overweight/obesity ([place]-3.3) related to past poor dietary habits and physical inactivity as evidenced by patient w/ planned sleeve gastrectomy surgery following dietary guidelines for continued weight loss.    NUTRITION INTERVENTION  Nutrition counseling (C-1) and education (E-2) to facilitate  bariatric surgery goals.  Educated pt on micronutrient deficiencies post surgery and strategies to mitigate that risk   Pre-Op Goals Reviewed with the Patient Track food and beverage intake (pen and paper, MyFitness Pal, Baritastic app, etc.) Make healthy food choices while monitoring portion sizes Consume 3 meals per day or try to eat every 3-5 hours Avoid concentrated sugars and fried foods Keep sugar & fat in the single digits per serving on food labels Practice CHEWING your food (aim for applesauce consistency) Practice not drinking 15 minutes before, during, and 30 minutes after each meal and snack Avoid all carbonated beverages (ex: soda, sparkling beverages)  Limit caffeinated beverages (ex: coffee, tea, energy drinks) Avoid all sugar-sweetened beverages (ex: regular soda, sports drinks)  Avoid alcohol  Aim for 64-100 ounces of FLUID daily (with at least half of fluid intake being plain water)  Aim for at least 60-80 grams of PROTEIN daily Look for a liquid protein source that contains ?15 g protein and ?5 g carbohydrate (ex: shakes, drinks, shots) Make a list of non-food related activities Physical activity is an important part of a healthy lifestyle so keep it moving! The goal is to reach 150 minutes of exercise per week, including cardiovascular and weight baring activity.  *Goals that are bolded indicate the pt would like to start working towards these  Handouts Provided Include  Bariatric Surgery handouts (Nutrition Visits, Pre-Op Goals, Protein Shakes, Vitamins & Minerals)  Learning Style & Readiness for Change Teaching method utilized: Visual & Auditory  Demonstrated degree of understanding via: Teach Back  Readiness Level: pre contemplative  Barriers to learning/adherence to lifestyle change: struggling  with change     MONITORING & EVALUATION Dietary intake, weekly physical activity, body weight, and pre-op goals reached at next nutrition visit.    Next Steps   Patient is to follow up at [facility] for Pre-Op Class >2 weeks before surgery for further nutrition education.   Pt has completed visits. No further supervised visits required/recomended

## 2021-06-11 NOTE — Telephone Encounter (Signed)
Reviewed cardiology notes, he has ECHO pending on 07/01/21 before being cleared. ?

## 2021-06-11 NOTE — Telephone Encounter (Signed)
noted 

## 2021-07-01 ENCOUNTER — Ambulatory Visit (HOSPITAL_COMMUNITY)
Admission: RE | Admit: 2021-07-01 | Discharge: 2021-07-01 | Disposition: A | Discharge status: Home / Self Care | Payer: 59 | Source: Ambulatory Visit | Attending: Cardiology | Admitting: Cardiology

## 2021-07-01 DIAGNOSIS — R0602 Shortness of breath: Secondary | ICD-10-CM

## 2021-07-01 LAB — ECHOCARDIOGRAM COMPLETE
AR max vel: 2.51 cm2
AV Area VTI: 2.85 cm2
AV Area mean vel: 2.57 cm2
AV Mean grad: 4 mmHg
AV Peak grad: 8.4 mmHg
Ao pk vel: 1.45 m/s
Area-P 1/2: 3.24 cm2
MV VTI: 2.95 cm2
S' Lateral: 3.1 cm

## 2021-07-01 NOTE — Progress Notes (Signed)
*  PRELIMINARY RESULTS* ?Echocardiogram ?2D Echocardiogram has been performed. ? ?Edgar Myers ?07/01/2021, 2:02 PM ?

## 2021-07-03 ENCOUNTER — Telehealth: Payer: Self-pay

## 2021-07-03 NOTE — Telephone Encounter (Signed)
Patient returned call in regards to these results. Transferred to [facility]. ?

## 2021-07-03 NOTE — Telephone Encounter (Signed)
-----   Message from Arnoldo Lenis, MD sent at 07/02/2021  4:10 PM EDT ----- ?Echo shows normal heart pumping function. Overall echo looks good, no worrisome findings. WOuld be ok to proceed with surgery ? ?Zandra Abts MD ?

## 2021-07-03 NOTE — Telephone Encounter (Signed)
Patient notified and verbalized understanding. Pt had no questions or concerns at this time. PCP/Surgeon copied.  ?

## 2021-07-07 ENCOUNTER — Other Ambulatory Visit: Payer: Self-pay

## 2021-07-07 ENCOUNTER — Encounter: Payer: 59 | Attending: General Surgery | Admitting: Skilled Nursing Facility1

## 2021-07-07 NOTE — Progress Notes (Signed)
Supervised Weight Loss Visit ?Bariatric Nutrition Education ? ? ?Sleeve Gastrectomy  ?1 out of 3 SWL Appointments  ? ? ?NUTRITION ASSESSMENT ? ?Anthropometrics  ?Start weight at [facility]: 468.3 lbs (date: 06/09/2021)  ?Weight: 475.9 pounds ?BMI: 70.28 kg/m2   ?  ?Clinical  ?Medical hx: HTN, DM, gout ?Medications: see list  ?Labs: creatinine 1.45, lipase 122 ?Notable signs/symptoms: headaches multiple times a week ?Any previous deficiencies? No ? ?Lifestyle & Dietary Hx ? ?Pt states he takes the lasix every other day.  ?Pt states he has learned shrimp give shim a gouty flare and beer so he avoids them.  ?Pt states he does not check his blood sugar.  ?Pt states his wife is supportive.  ? ?Pt states his wife has been in and out of the H so life has been tough lately. Pt states he has really just been trying to exist lately.  ?Pt states he has been trying to eat 3 meals  day every day and has been trying to limit fast food. Pt states he is trying to reduce red meat so he is going to try meatless options.  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 ?Pt states hearing his son say come on dad lets go play keeps him motivated to go for walks.  ? ?Estimated daily fluid intake:  oz ?Supplements:  ?Current average weekly physical activity: 30 minutes walking 7 days a week ? ?24-Hr Dietary Recall ?First Meal: oatmeal + banana or cereal ?Snack:  ?Second Meal: sandwich ?Snack:  ?Third Meal: baked pork chop and collards  ?Snack: popcorn ?Beverages: water, sweet tea ? ?Estimated Energy Needs ?Calories: 1800 ? ? ?NUTRITION DIAGNOSIS  ?Overweight/obesity ([place]-3.3) related to past poor dietary habits and physical inactivity as evidenced by patient w/ planned sleeve gastrectomy surgery following dietary guidelines for continued weight loss. ? ? ?NUTRITION INTERVENTION  ?Nutrition counseling (C-1) and education (E-2) to facilitate bariatric surgery goals. ? ?Pre-Op Goals Progress & New Goals ?Continue: reduce fast  food ?Continue: eat 3 meals a day ?Continue: walking most days of the week ?NEW: cut out sweet tea ?NEW: read the label for sweets and follow the serving size to help make mindful decisions  ? ? ?Learning Style & Readiness for Change ?Teaching method utilized: Visual & Auditory  ?Demonstrated degree of understanding via: Teach Back  ?Readiness Level: contemplative  ?Barriers to learning/adherence to lifestyle change: swing shift ? ?RD's Notes for next Visit  ?Assess pts adherence to chosen goals  ? ? ?MONITORING & EVALUATION ?Dietary intake, weekly physical activity, body weight, and pre-op goals  ? ?Next Steps  ?Patient is to return to [facility] in 1 month ?

## 2021-07-08 NOTE — Telephone Encounter (Signed)
Cardiac clearance for weight loss surgery, see echo result note. ? ?Ok to proceed with screening colonoscopy with Dr. Abbey Chatters, ASA 3. ?Hold [place] one week before TCS. ?Day of prep: metformin half dose. ?AM of TCS: hold metformin ?

## 2021-07-08 NOTE — Telephone Encounter (Signed)
Tried to call pt, LMOVM for return call. 

## 2021-07-09 ENCOUNTER — Telehealth: Payer: Self-pay

## 2021-07-09 MED ORDER — PEG 3350-KCL-NA BICARB-NACL 420 G PO SOLR: 4000.0000 mL | ORAL | 0 refills | Status: DC

## 2021-07-09 NOTE — Telephone Encounter (Signed)
Pt's wife called office, TCS scheduled for 07/28/21 at 2:45pm. Informed her pt will need to hold [facility] for one week before TCS. She said he is no longer taking Metformin. Rx for prep sent to pharmacy. Orders entered. ? ?PA for TCS submitted via [facility] website. No PA required. Decision ID# [id number]. ?

## 2021-07-09 NOTE — Telephone Encounter (Signed)
Opened in error

## 2021-07-09 NOTE — Addendum Note (Signed)
Addended by: Hassan Rowan on: 07/09/2021 09:28 AM ? ? Modules accepted: Orders ? ?

## 2021-07-09 NOTE — Telephone Encounter (Signed)
Called [employer] for PA. Spoke to [employer] PA initiated for TCS. Auth# [id number], valid till 10/26/21. Call ref# [id number]. She said someone will call to complete case. ?

## 2021-07-10 ENCOUNTER — Other Ambulatory Visit: Payer: Self-pay

## 2021-07-10 NOTE — Telephone Encounter (Signed)
PA approved for TCS per [facility] website. PA# [id number], valid 07/28/21-10/26/21. ?

## 2021-07-21 NOTE — Patient Instructions (Signed)
? ? ? ? ? ? Edgar Myers. ? 07/21/2021  ?  ? '@PREFPERIOPPHARMACY'$ @ ? ? Your procedure is scheduled on 07/28/2021. ? ? Report to [facility] at  1245  A.M. ? ? Call this number if you have problems the morning of surgery: ? [phone] ? ? Remember: ? Follow the diet and prep instructions given to you by the office. ? ?Use your inhaler before you come and bring your rescue inhaler with you. ? ?DO NOT smoke tobacco or vape for 24 hours before your procedure. ? ?  ? Take these medicines the morning of surgery with A SIP OF WATER  ? ?Bystolic, colchicine. ? ?  ? Do not wear jewelry, make-up or nail polish. ? Do not wear lotions, powders, or perfumes, or deodorant. ? Do not shave 48 hours prior to surgery.  Men may shave face and neck. ? Do not bring valuables to the hospital. ? [facility] is not responsible for any belongings or valuables. ? ?Contacts, dentures or bridgework may not be worn into surgery.  Leave your suitcase in the car.  After surgery it may be brought to your room. ? ?For patients admitted to the hospital, discharge time will be determined by your treatment team. ? ?Patients discharged the day of surgery will not be allowed to drive home and must have someone with them for 24 hours.  ? ? ?Special instructions:   DO NOT smoke tobacco or vape for 24 hours before your procedure. ? ?Please read over the following fact sheets that you were given. ?Anesthesia Post-op Instructions and Care and Recovery After Surgery ?  ? ? ? Colonoscopy, Adult, Care After ?This sheet gives you information about how to care for yourself after your procedure. Your health care provider may also give you more specific instructions. If you have problems or questions, contact your health care provider. ?What can I expect after the procedure? ?After the procedure, it is common to have: ?A small amount of blood in your stool for 24 hours after the procedure. ?Some gas. ?Mild cramping or bloating of your abdomen. ?Follow these  instructions at home: ?Eating and drinking ? ?Drink enough fluid to keep your urine pale yellow. ?Follow instructions from your health care provider about eating or drinking restrictions. ?Resume your normal diet as instructed by your health care provider. Avoid heavy or fried foods that are hard to digest. ?Activity ?Rest as told by your health care provider. ?Avoid sitting for a long time without moving. Get up to take short walks every 1-2 hours. This is important to improve blood flow and breathing. Ask for help if you feel weak or unsteady. ?Return to your normal activities as told by your health care provider. Ask your health care provider what activities are safe for you. ?Managing cramping and bloating ? ?Try walking around when you have cramps or feel bloated. ?Apply heat to your abdomen as told by your health care provider. Use the heat source that your health care provider recommends, such as a moist heat pack or a heating pad. ?Place a towel between your skin and the heat source. ?Leave the heat on for 20-30 minutes. ?Remove the heat if your skin turns bright red. This is especially important if you are unable to feel pain, heat, or cold. You may have a greater risk of getting burned. ?General instructions ?If you were given a sedative during the procedure, it can affect you for several hours. Do not drive or operate machinery  until your health care provider says that it is safe. ?For the first 24 hours after the procedure: ?Do not sign important documents. ?Do not drink alcohol. ?Do your regular daily activities at a slower pace than normal. ?Eat soft foods that are easy to digest. ?Take over-the-counter and prescription medicines only as told by your health care provider. ?Keep all follow-up visits as told by your health care provider. This is important. ?Contact a health care provider if: ?You have blood in your stool 2-3 days after the procedure. ?Get help right away if you have: ?More than a small  spotting of blood in your stool. ?Large blood clots in your stool. ?Swelling of your abdomen. ?Nausea or vomiting. ?A fever. ?Increasing pain in your abdomen that is not relieved with medicine. ?Summary ?After the procedure, it is common to have a small amount of blood in your stool. You may also have mild cramping and bloating of your abdomen. ?If you were given a sedative during the procedure, it can affect you for several hours. Do not drive or operate machinery until your health care provider says that it is safe. ?Get help right away if you have a lot of blood in your stool, nausea or vomiting, a fever, or increased pain in your abdomen. ?This information is not intended to replace advice given to you by your health care provider. Make sure you discuss any questions you have with your health care provider. ?Document Revised: 02/03/2019 Document Reviewed: 10/24/2018 ?Elsevier Patient Education ? [place]. ?Monitored Anesthesia Care, Care After ?This sheet gives you information about how to care for yourself after your procedure. Your health care provider may also give you more specific instructions. If you have problems or questions, contact your health care provider. ?What can I expect after the procedure? ?After the procedure, it is common to have: ?Tiredness. ?Forgetfulness about what happened after the procedure. ?Impaired judgment for important decisions. ?Nausea or vomiting. ?Some difficulty with balance. ?Follow these instructions at home: ?For the time period you were told by your health care provider: ?  ?Rest as needed. ?Do not participate in activities where you could fall or become injured. ?Do not drive or use machinery. ?Do not drink alcohol. ?Do not take sleeping pills or medicines that cause drowsiness. ?Do not make important decisions or sign legal documents. ?Do not take care of children on your own. ?Eating and drinking ?Follow the diet that is recommended by your health care  provider. ?Drink enough fluid to keep your urine pale yellow. ?If you vomit: ?Drink water, juice, or soup when you can drink without vomiting. ?Make sure you have little or no nausea before eating solid foods. ?General instructions ?Have a responsible adult stay with you for the time you are told. It is important to have someone help care for you until you are awake and alert. ?Take over-the-counter and prescription medicines only as told by your health care provider. ?If you have sleep apnea, surgery and certain medicines can increase your risk for breathing problems. Follow instructions from your health care provider about wearing your sleep device: ?Anytime you are sleeping, including during daytime naps. ?While taking prescription pain medicines, sleeping medicines, or medicines that make you drowsy. ?Avoid smoking. ?Keep all follow-up visits as told by your health care provider. This is important. ?Contact a health care provider if: ?You keep feeling nauseous or you keep vomiting. ?You feel light-headed. ?You are still sleepy or having trouble with balance after 24 hours. ?You develop a  rash. ?You have a fever. ?You have redness or swelling around the IV site. ?Get help right away if: ?You have trouble breathing. ?You have new-onset confusion at home. ?Summary ?For several hours after your procedure, you may feel tired. You may also be forgetful and have poor judgment. ?Have a responsible adult stay with you for the time you are told. It is important to have someone help care for you until you are awake and alert. ?Rest as told. Do not drive or operate machinery. Do not drink alcohol or take sleeping pills. ?Get help right away if you have trouble breathing, or if you suddenly become confused. ?This information is not intended to replace advice given to you by your health care provider. Make sure you discuss any questions you have with your health care provider. ?Document Revised: 12/14/2019 Document Reviewed:  03/02/2019 ?Elsevier Patient Education ? [place]. ? ?

## 2021-07-23 ENCOUNTER — Telehealth: Payer: Self-pay | Admitting: Internal Medicine

## 2021-07-23 ENCOUNTER — Encounter: Payer: Self-pay | Admitting: *Deleted

## 2021-07-23 NOTE — Telephone Encounter (Signed)
Pt returning call. [phone] ?

## 2021-07-23 NOTE — Telephone Encounter (Signed)
Pt called back. Rescheduled to 5/22. Aware will mail new prep instructions and pre-op APPT ?PA approved for TCS per [facility] website. PA# [id number], valid 07/28/21-10/26/21. ?

## 2021-07-23 NOTE — Telephone Encounter (Signed)
Pt needs to cancel his pre op for tomorrow and his procedure with Dr Abbey Chatters on 4/17. He can't get time off work and needs to reschedule. [phone] ?

## 2021-07-23 NOTE — Telephone Encounter (Signed)
See prior message

## 2021-07-23 NOTE — Telephone Encounter (Signed)
LMOVM to call BACK ?

## 2021-07-24 ENCOUNTER — Encounter (HOSPITAL_COMMUNITY)
Admission: RE | Admit: 2021-07-24 | Discharge: 2021-07-24 | Disposition: A | Discharge status: Home / Self Care | Payer: 59 | Source: Ambulatory Visit | Attending: Internal Medicine | Admitting: Internal Medicine

## 2021-07-24 ENCOUNTER — Encounter (HOSPITAL_COMMUNITY): Payer: Self-pay

## 2021-07-24 DIAGNOSIS — E119 Type 2 diabetes mellitus without complications: Secondary | ICD-10-CM

## 2021-08-07 ENCOUNTER — Encounter: Payer: 59 | Attending: General Surgery | Admitting: Skilled Nursing Facility1

## 2021-08-07 DIAGNOSIS — Z6841 Body Mass Index (BMI) 40.0 and over, adult: Secondary | ICD-10-CM | POA: Diagnosis not present

## 2021-08-07 DIAGNOSIS — I10 Essential (primary) hypertension: Secondary | ICD-10-CM | POA: Insufficient documentation

## 2021-08-07 DIAGNOSIS — E119 Type 2 diabetes mellitus without complications: Secondary | ICD-10-CM | POA: Insufficient documentation

## 2021-08-07 DIAGNOSIS — Z713 Dietary counseling and surveillance: Secondary | ICD-10-CM | POA: Diagnosis not present

## 2021-08-07 NOTE — Progress Notes (Signed)
Supervised Weight Loss Visit ?Bariatric Nutrition Education ? ? ?Sleeve Gastrectomy  ?2 out of 3 SWL Appointments  ? ? ?NUTRITION ASSESSMENT ? ?Anthropometrics  ?Start weight at [facility]: 468.3 lbs (date: 06/09/2021)  ?Weight: 470 pounds ?BMI: 69.53 kg/m2   ?  ?Clinical  ?Medical hx: HTN, DM, gout ?Medications: see list  ?Labs: creatinine 1.45, lipase 122 ?Notable signs/symptoms: headaches multiple times a week ?Any previous deficiencies? No ? ?Lifestyle & Dietary Hx ? ?Pt states he takes the lasix every other day.  ?Pt states he has learned shrimp give shim a gouty flare and beer so he avoids them.  ?Pt states he does not check his blood sugar.  ?Pt states his wife is supportive.  ? 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 ? ?Pt is doing a great job with change! ? ?Pt states he has severely reduced his sweet tea and soda. ?Pt states his gout has been terrible lately reducing his overall activity.  ?Pt states he can already feel the overall difference with his body from making the dietary changes.  ? ?Pt was disappointed with his weight loss: Dietitian encouraged pt this was a great weight loss amount! ? ? ?Estimated daily fluid intake:  oz ?Supplements:  ?Current average weekly physical activity: 30 minutes walking 7 days a week ? ?24-Hr Dietary Recall ?First Meal: oatmeal + banana or cereal or eggs and fruit ?Snack: sometimes yogurt or pork rinds or slim jims ?Second Meal: sandwich on wheat with [place] or chicken and fruit ?Snack:  ?Third Meal: baked pork chop and collards or chicken or salmon and salad ?Snack:  ?Beverages: water, sweet tea, soda ? ?Estimated Energy Needs ?Calories: 1800 ? ? ?NUTRITION DIAGNOSIS  ?Overweight/obesity ([place]-3.3) related to past poor dietary habits and physical inactivity as evidenced by patient w/ planned sleeve gastrectomy surgery following dietary guidelines for continued weight loss. ? ? ?NUTRITION INTERVENTION  ?Nutrition counseling (C-1) and education (E-2) to  facilitate bariatric surgery goals. ? ?Pre-Op Goals Progress & New Goals ?Continue: reduce fast food ?Continue: eat 3 meals a day ?Continue: walking most days of the week ?continue: cut out sweet tea ?Continue/NEW: read the label for sweets and follow the serving size to help make mindful decisions  ?NEW: reduce the slim jims overall ?NEW: add a non starchy vegetables to lunch every day ? ? ?Learning Style & Readiness for Change ?Teaching method utilized: Visual & Auditory  ?Demonstrated degree of understanding via: Teach Back  ?Readiness Level: contemplative  ?Barriers to learning/adherence to lifestyle change: swing shift ? ?RD's Notes for next Visit  ?Assess pts adherence to chosen goals  ? ? ?MONITORING & EVALUATION ?Dietary intake, weekly physical activity, body weight, and pre-op goals  ? ?Next Steps  ?Patient is to return to [facility] in 1 month ?

## 2021-08-26 ENCOUNTER — Ambulatory Visit: Payer: 59 | Admitting: Pulmonary Disease

## 2021-08-27 NOTE — Patient Instructions (Signed)
? ? ? ? ? ? Edgar Myers. ? 08/27/2021  ?  ? '@PREFPERIOPPHARMACY'$ @ ? ? Your procedure is scheduled on  09/01/2021. ? ? Report to [facility] at  0600 A.M. ? ? Call this number if you have problems the morning of surgery: ? [phone] ? ? Remember: ? Follow the diet and prep instructions given to you by the office. ? ?  Use your inhalers before you come and bring your rescue inhaler with you. ? ?   DO NOT take you mounjaro on 08/31/2021. ? ?  DO NOT take any medications for diabetes the morning of your procedure. ?  ? Take these medicines the morning of surgery with A SIP OF WATER  ? ?                                    zyrtec, bystolic. ?  ? ? Do not wear jewelry, make-up or nail polish. ? Do not wear lotions, powders, or perfumes, or deodorant. ? Do not shave 48 hours prior to surgery.  Men may shave face and neck. ? Do not bring valuables to the hospital. ? [facility] is not responsible for any belongings or valuables. ? ?Contacts, dentures or bridgework may not be worn into surgery.  Leave your suitcase in the car.  After surgery it may be brought to your room. ? ?For patients admitted to the hospital, discharge time will be determined by your treatment team. ? ?Patients discharged the day of surgery will not be allowed to drive home and must have someone with them for 24 hours.  ? ? ?Special instructions:   DO NOT smoke tobacco or vape for 24 hours before your procedure. ? ?Please read over the following fact sheets that you were given. ?Anesthesia Post-op Instructions and Care and Recovery After Surgery ?  ? ? ? Colonoscopy, Adult, Care After ?The following information offers guidance on how to care for yourself after your procedure. Your health care provider may also give you more specific instructions. If you have problems or questions, contact your health care provider. ?What can I expect after the procedure? ?After the procedure, it is common to have: ?A small amount of blood in your stool for 24 hours  after the procedure. ?Some gas. ?Mild cramping or bloating of your abdomen. ?Follow these instructions at home: ?Eating and drinking ? ?Drink enough fluid to keep your urine pale yellow. ?Follow instructions from your health care provider about eating or drinking restrictions. ?Resume your normal diet as told by your health care provider. Avoid heavy or fried foods that are hard to digest. ?Activity ?Rest as told by your health care provider. ?Avoid sitting for a long time without moving. Get up to take short walks every 1-2 hours. This is important to improve blood flow and breathing. Ask for help if you feel weak or unsteady. ?Return to your normal activities as told by your health care provider. Ask your health care provider what activities are safe for you. ?Managing cramping and bloating ? ?Try walking around when you have cramps or feel bloated. ?If directed, apply heat to your abdomen as told by your health care provider. Use the heat source that your health care provider recommends, such as a moist heat pack or a heating pad. ?Place a towel between your skin and the heat source. ?Leave the heat on for 20-30 minutes. ?Remove the heat if  your skin turns bright red. This is especially important if you are unable to feel pain, heat, or cold. You have a greater risk of getting burned. ?General instructions ?If you were given a sedative during the procedure, it can affect you for several hours. Do not drive or operate machinery until your health care provider says that it is safe. ?For the first 24 hours after the procedure: ?Do not sign important documents. ?Do not drink alcohol. ?Do your regular daily activities at a slower pace than normal. ?Eat soft foods that are easy to digest. ?Take over-the-counter and prescription medicines only as told by your health care provider. ?Keep all follow-up visits. This is important. ?Contact a health care provider if: ?You have blood in your stool 2-3 days after the  procedure. ?Get help right away if: ?You have more than a small spotting of blood in your stool. ?You have large blood clots in your stool. ?You have swelling of your abdomen. ?You have nausea or vomiting. ?You have a fever. ?You have increasing pain in your abdomen that is not relieved with medicine. ?These symptoms may be an emergency. Get help right away. Call 911. ?Do not wait to see if the symptoms will go away. ?Do not drive yourself to the hospital. ?Summary ?After the procedure, it is common to have a small amount of blood in your stool. You may also have mild cramping and bloating of your abdomen. ?If you were given a sedative during the procedure, it can affect you for several hours. Do not drive or operate machinery until your health care provider says that it is safe. ?Get help right away if you have a lot of blood in your stool, nausea or vomiting, a fever, or increased pain in your abdomen. ?This information is not intended to replace advice given to you by your health care provider. Make sure you discuss any questions you have with your health care provider. ?Document Revised: 11/20/2020 Document Reviewed: 11/20/2020 ?Elsevier Patient Education ? [place]. ?Monitored Anesthesia Care, Care After ?This sheet gives you information about how to care for yourself after your procedure. Your health care provider may also give you more specific instructions. If you have problems or questions, contact your health care provider. ?What can I expect after the procedure? ?After the procedure, it is common to have: ?Tiredness. ?Forgetfulness about what happened after the procedure. ?Impaired judgment for important decisions. ?Nausea or vomiting. ?Some difficulty with balance. ?Follow these instructions at home: ?For the time period you were told by your health care provider: ? ?  ? ?Rest as needed. ?Do not participate in activities where you could fall or become injured. ?Do not drive or use machinery. ?Do  not drink alcohol. ?Do not take sleeping pills or medicines that cause drowsiness. ?Do not make important decisions or sign legal documents. ?Do not take care of children on your own. ?Eating and drinking ?Follow the diet that is recommended by your health care provider. ?Drink enough fluid to keep your urine pale yellow. ?If you vomit: ?Drink water, juice, or soup when you can drink without vomiting. ?Make sure you have little or no nausea before eating solid foods. ?General instructions ?Have a responsible adult stay with you for the time you are told. It is important to have someone help care for you until you are awake and alert. ?Take over-the-counter and prescription medicines only as told by your health care provider. ?If you have sleep apnea, surgery and certain medicines can increase  your risk for breathing problems. Follow instructions from your health care provider about wearing your sleep device: ?Anytime you are sleeping, including during daytime naps. ?While taking prescription pain medicines, sleeping medicines, or medicines that make you drowsy. ?Avoid smoking. ?Keep all follow-up visits as told by your health care provider. This is important. ?Contact a health care provider if: ?You keep feeling nauseous or you keep vomiting. ?You feel light-headed. ?You are still sleepy or having trouble with balance after 24 hours. ?You develop a rash. ?You have a fever. ?You have redness or swelling around the IV site. ?Get help right away if: ?You have trouble breathing. ?You have new-onset confusion at home. ?Summary ?For several hours after your procedure, you may feel tired. You may also be forgetful and have poor judgment. ?Have a responsible adult stay with you for the time you are told. It is important to have someone help care for you until you are awake and alert. ?Rest as told. Do not drive or operate machinery. Do not drink alcohol or take sleeping pills. ?Get help right away if you have trouble  breathing, or if you suddenly become confused. ?This information is not intended to replace advice given to you by your health care provider. Make sure you discuss any questions you have with your health care prov

## 2021-08-28 ENCOUNTER — Encounter (HOSPITAL_COMMUNITY): Payer: Self-pay

## 2021-08-28 ENCOUNTER — Encounter (HOSPITAL_COMMUNITY)
Admission: RE | Admit: 2021-08-28 | Discharge: 2021-08-28 | Disposition: A | Discharge status: Home / Self Care | Payer: 59 | Source: Ambulatory Visit | Attending: Internal Medicine | Admitting: Internal Medicine

## 2021-08-28 DIAGNOSIS — E119 Type 2 diabetes mellitus without complications: Secondary | ICD-10-CM | POA: Diagnosis not present

## 2021-08-28 DIAGNOSIS — Z01812 Encounter for preprocedural laboratory examination: Secondary | ICD-10-CM | POA: Diagnosis present

## 2021-08-28 HISTORY — DX: Other specified postprocedural states: Z98.890

## 2021-08-28 HISTORY — DX: Other specified postprocedural states: R11.2

## 2021-08-28 LAB — BASIC METABOLIC PANEL
Anion gap: 7 (ref 5–15)
BUN: 10 mg/dL (ref 6–20)
CO2: 27 mmol/L (ref 22–32)
Calcium: 9.1 mg/dL (ref 8.9–10.3)
Chloride: 104 mmol/L (ref 98–111)
Creatinine, Ser: 1.14 mg/dL (ref 0.61–1.24)
GFR, Estimated: >60 mL/min (ref >60)
Glucose, Bld: 139 mg/dL — ABNORMAL HIGH (ref 70–99)
Potassium: 3.8 mmol/L (ref 3.5–5.1)
Sodium: 138 mmol/L (ref 135–145)

## 2021-09-01 ENCOUNTER — Ambulatory Visit (HOSPITAL_BASED_OUTPATIENT_CLINIC_OR_DEPARTMENT_OTHER): Payer: 59 | Admitting: Anesthesiology

## 2021-09-01 ENCOUNTER — Encounter (HOSPITAL_COMMUNITY): Payer: Self-pay

## 2021-09-01 ENCOUNTER — Ambulatory Visit (HOSPITAL_COMMUNITY)
Admission: RE | Admit: 2021-09-01 | Discharge: 2021-09-01 | Disposition: A | Discharge status: Home / Self Care | Payer: 59 | Attending: Internal Medicine | Admitting: Internal Medicine

## 2021-09-01 ENCOUNTER — Encounter (HOSPITAL_COMMUNITY)
Admission: RE | Disposition: A | Discharge status: Home / Self Care | Payer: Self-pay | Source: Home / Self Care | Attending: Internal Medicine

## 2021-09-01 ENCOUNTER — Ambulatory Visit (HOSPITAL_COMMUNITY): Payer: 59 | Admitting: Anesthesiology

## 2021-09-01 DIAGNOSIS — Z7951 Long term (current) use of inhaled steroids: Secondary | ICD-10-CM | POA: Diagnosis not present

## 2021-09-01 DIAGNOSIS — I10 Essential (primary) hypertension: Secondary | ICD-10-CM | POA: Diagnosis not present

## 2021-09-01 DIAGNOSIS — G4730 Sleep apnea, unspecified: Secondary | ICD-10-CM | POA: Insufficient documentation

## 2021-09-01 DIAGNOSIS — D122 Benign neoplasm of ascending colon: Secondary | ICD-10-CM | POA: Diagnosis not present

## 2021-09-01 DIAGNOSIS — Z7984 Long term (current) use of oral hypoglycemic drugs: Secondary | ICD-10-CM | POA: Diagnosis not present

## 2021-09-01 DIAGNOSIS — E119 Type 2 diabetes mellitus without complications: Secondary | ICD-10-CM | POA: Insufficient documentation

## 2021-09-01 DIAGNOSIS — Z79899 Other long term (current) drug therapy: Secondary | ICD-10-CM | POA: Insufficient documentation

## 2021-09-01 DIAGNOSIS — K648 Other hemorrhoids: Secondary | ICD-10-CM

## 2021-09-01 DIAGNOSIS — R0602 Shortness of breath: Secondary | ICD-10-CM

## 2021-09-01 DIAGNOSIS — Z1211 Encounter for screening for malignant neoplasm of colon: Secondary | ICD-10-CM

## 2021-09-01 DIAGNOSIS — K635 Polyp of colon: Secondary | ICD-10-CM

## 2021-09-01 DIAGNOSIS — Z794 Long term (current) use of insulin: Secondary | ICD-10-CM | POA: Insufficient documentation

## 2021-09-01 DIAGNOSIS — D120 Benign neoplasm of cecum: Secondary | ICD-10-CM | POA: Diagnosis not present

## 2021-09-01 DIAGNOSIS — Z6841 Body Mass Index (BMI) 40.0 and over, adult: Secondary | ICD-10-CM | POA: Insufficient documentation

## 2021-09-01 HISTORY — PX: COLONOSCOPY WITH PROPOFOL: SHX5780

## 2021-09-01 HISTORY — PX: POLYPECTOMY: SHX5525

## 2021-09-01 LAB — GLUCOSE, CAPILLARY: Glucose-Capillary: 115 mg/dL — ABNORMAL HIGH (ref 70–99)

## 2021-09-01 SURGERY — COLONOSCOPY WITH PROPOFOL
Anesthesia: General

## 2021-09-01 MED ORDER — PROPOFOL 500 MG/50ML IV EMUL
INTRAVENOUS | Status: DC | PRN
  Administered 2021-09-01: 125 ug/kg/min via INTRAVENOUS

## 2021-09-01 MED ORDER — LACTATED RINGERS IV SOLN: INTRAVENOUS | Status: DC

## 2021-09-01 MED ORDER — PROPOFOL 10 MG/ML IV BOLUS
INTRAVENOUS | Status: DC | PRN
  Administered 2021-09-01: 100 mg via INTRAVENOUS

## 2021-09-01 MED ORDER — LIDOCAINE HCL (CARDIAC) PF 100 MG/5ML IV SOSY
PREFILLED_SYRINGE | INTRAVENOUS | Status: DC | PRN
  Administered 2021-09-01: 50 mg via INTRAVENOUS

## 2021-09-01 NOTE — Anesthesia Postprocedure Evaluation (Signed)
Anesthesia Post Note  Patient: Edgar Myers.  Procedure(s) Performed: COLONOSCOPY WITH PROPOFOL POLYPECTOMY  Patient location during evaluation: Phase II Anesthesia Type: General Level of consciousness: awake and alert and oriented Pain management: pain level controlled Vital Signs Assessment: post-procedure vital signs reviewed and stable Respiratory status: spontaneous breathing, nonlabored ventilation and respiratory function stable Cardiovascular status: blood pressure returned to baseline and stable Postop Assessment: no apparent nausea or vomiting Anesthetic complications: no   No notable events documented.   Last Vitals:  Vitals:   09/01/21 0656 09/01/21 0750  BP: 140/70 108/62  Pulse: 66 73  Resp: 18 19  Temp: 36.8 C 36.4 C  SpO2: 98% 96%    Last Pain:  Vitals:   09/01/21 0750  TempSrc: Oral  PainSc: 0-No pain                 Jeffrey Voth C Vidur Knust

## 2021-09-01 NOTE — Anesthesia Preprocedure Evaluation (Signed)
Anesthesia Evaluation  Patient identified by MRN, date of birth, ID band Patient awake    Reviewed: Allergy & Precautions, NPO status , Patient's Chart, lab work & pertinent test results  History of Anesthesia Complications (+) PONV and history of anesthetic complications  Airway Mallampati: III  TM Distance: >3 FB Neck ROM: Full    Dental  (+) Dental Advisory Given, Chipped, Missing,    Pulmonary sleep apnea and Continuous Positive Airway Pressure Ventilation ,    Pulmonary exam normal breath sounds clear to auscultation       Cardiovascular hypertension, Pt. on medications Normal cardiovascular exam Rhythm:Regular Rate:Normal     Neuro/Psych negative neurological ROS  negative psych ROS   GI/Hepatic negative GI ROS, Neg liver ROS,   Endo/Other  diabetes, Well Controlled, Type 2, Oral Hypoglycemic Agents, Insulin DependentMorbid obesity  Renal/GU negative Renal ROS  negative genitourinary   Musculoskeletal negative musculoskeletal ROS (+)   Abdominal   Peds negative pediatric ROS (+)  Hematology negative hematology ROS (+)   Anesthesia Other Findings   Reproductive/Obstetrics negative OB ROS                            Anesthesia Physical Anesthesia Plan  ASA: 3  Anesthesia Plan: General   Post-op Pain Management: Minimal or no pain anticipated   Induction: Intravenous  PONV Risk Score and Plan: Propofol infusion  Airway Management Planned: Nasal Cannula, Natural Airway and Simple Face Mask  Additional Equipment:   Intra-op Plan:   Post-operative Plan:   Informed Consent: I have reviewed the patients History and Physical, chart, labs and discussed the procedure including the risks, benefits and alternatives for the proposed anesthesia with the patient or authorized representative who has indicated his/her understanding and acceptance.     Dental advisory given  Plan  Discussed with: CRNA and Surgeon  Anesthesia Plan Comments:         Anesthesia Quick Evaluation

## 2021-09-01 NOTE — Transfer of Care (Signed)
Immediate Anesthesia Transfer of Care Note  Patient: Edgar Myers.  Procedure(s) Performed: COLONOSCOPY WITH PROPOFOL POLYPECTOMY  Patient Location: Short Stay  Anesthesia Type:General  Level of Consciousness: awake, alert  and oriented  Airway & Oxygen Therapy: Patient Spontanous Breathing  Post-op Assessment: Report given to RN and Post -op Vital signs reviewed and stable  Post vital signs: Reviewed and stable  Last Vitals:  Vitals Value Taken Time  BP 108/62 09/01/21 0750  Temp 36.4 C 09/01/21 0750  Pulse 73 09/01/21 0750  Resp 19 09/01/21 0750  SpO2 96 % 09/01/21 0750    Last Pain:  Vitals:   09/01/21 0750  TempSrc: Oral  PainSc: 0-No pain      Patients Stated Pain Goal: 7 (63/86/85 4883)  Complications: No notable events documented.

## 2021-09-01 NOTE — Op Note (Signed)
[facility] Patient Name: Edgar Myers Procedure Date: 09/01/2021 7:02 AM MRN: [id number] Date of Birth: 19-Jul-1970 Attending MD: Elon Alas. Abbey Chatters DO CSN: [id number] Age: 51 Admit Type: Outpatient Procedure:                Colonoscopy Indications:              Screening for colorectal malignant neoplasm Providers:                Elon Alas. Abbey Chatters, DO, Janeece Riggers, RN, Raphael Gibney, Technician, Caprice Kluver Referring MD:              Medicines:                See the Anesthesia note for documentation of the                            administered medications Complications:            No immediate complications. Estimated Blood Loss:     Estimated blood loss was minimal. Procedure:                Pre-Anesthesia Assessment:                           - The anesthesia plan was to use monitored                            anesthesia care (MAC).                           After obtaining informed consent, the colonoscope                            was passed under direct vision. Throughout the                            procedure, the patient's blood pressure, pulse, and                            oxygen saturations were monitored continuously. The                            [id number]-HQ190L ([id number]) scope was introduced through                            the anus and advanced to the the cecum, identified                            by appendiceal orifice and ileocecal valve. The                            colonoscopy was performed without difficulty. The                            patient tolerated the procedure  well. The quality                            of the bowel preparation was evaluated using the                            BBPS [facility] Bowel Preparation Scale) with scores                            of: Right Colon = 3, Transverse Colon = 3 and Left                            Colon = 3 (entire mucosa seen well with no residual                             staining, small fragments of stool or opaque                            liquid). The total BBPS score equals 9. Scope In: 6:43:32 AM Scope Out: 7:45:58 AM Scope Withdrawal Time: 0 hours 7 minutes 22 seconds  Total Procedure Duration: 0 hours 9 minutes 4 seconds  Findings:      The perianal and digital rectal examinations were normal.      Non-bleeding internal hemorrhoids were found during endoscopy.      A 10 mm polyp was found in the appendiceal orifice. The polyp was       sessile. The polyp was removed with a cold snare. Resection and       retrieval were complete.      A 5 mm polyp was found in the ascending colon. The polyp was sessile.       The polyp was removed with a cold snare. Resection and retrieval were       complete.      The exam was otherwise without abnormality. Impression:               - Non-bleeding internal hemorrhoids.                           - One 10 mm polyp at the appendiceal orifice,                            removed with a cold snare. Resected and retrieved.                           - One 5 mm polyp in the ascending colon, removed                            with a cold snare. Resected and retrieved.                           - The examination was otherwise normal. Moderate Sedation:      Per Anesthesia Care Recommendation:           - Patient has a contact number available for  emergencies. The signs and symptoms of potential                            delayed complications were discussed with the                            patient. Return to normal activities tomorrow.                            Written discharge instructions were provided to the                            patient.                           - Resume previous diet.                           - Continue present medications.                           - Await pathology results.                           - Repeat colonoscopy in 3 years for surveillance.                            - Return to GI clinic PRN. Procedure Code(s):        --- Professional ---                           [phone], Colonoscopy, flexible; with removal of                            tumor(s), polyp(s), or other lesion(s) by snare                            technique Diagnosis Code(s):        --- Professional ---                           Z12.11, Encounter for screening for malignant                            neoplasm of colon                           K63.5, Polyp of colon                           K64.8, Other hemorrhoids CPT copyright 2019 American Medical Association. All rights reserved. The codes documented in this report are preliminary and upon coder review may  be revised to meet current compliance requirements. Elon Alas. Abbey Chatters, DO [place] Abbey Chatters, DO 09/01/2021 7:49:35 AM This report has been signed electronically. Number of Addenda: 0

## 2021-09-01 NOTE — Discharge Instructions (Addendum)
  Colonoscopy Discharge Instructions  Read the instructions outlined below and refer to this sheet in the next few weeks. These discharge instructions provide you with general information on caring for yourself after you leave the hospital. Your doctor may also give you specific instructions. While your treatment has been planned according to the most current medical practices available, unavoidable complications occasionally occur.   ACTIVITY You may resume your regular activity, but move at a slower pace for the next 24 hours.  Take frequent rest periods for the next 24 hours.  Walking will help get rid of the air and reduce the bloated feeling in your belly (abdomen).  No driving for 24 hours (because of the medicine (anesthesia) used during the test).   Do not sign any important legal documents or operate any machinery for 24 hours (because of the anesthesia used during the test).  NUTRITION Drink plenty of fluids.  You may resume your normal diet as instructed by your doctor.  Begin with a light meal and progress to your normal diet. Heavy or fried foods are harder to digest and may make you feel sick to your stomach (nauseated).  Avoid alcoholic beverages for 24 hours or as instructed.  MEDICATIONS You may resume your normal medications unless your doctor tells you otherwise.  WHAT YOU CAN EXPECT TODAY Some feelings of bloating in the abdomen.  Passage of more gas than usual.  Spotting of blood in your stool or on the toilet paper.  IF YOU HAD POLYPS REMOVED DURING THE COLONOSCOPY: No aspirin products for 7 days or as instructed.  No alcohol for 7 days or as instructed.  Eat a soft diet for the next 24 hours.  FINDING OUT THE RESULTS OF YOUR TEST Not all test results are available during your visit. If your test results are not back during the visit, make an appointment with your caregiver to find out the results. Do not assume everything is normal if you have not heard from your  caregiver or the medical facility. It is important for you to follow up on all of your test results.  SEEK IMMEDIATE MEDICAL ATTENTION IF: You have more than a spotting of blood in your stool.  Your belly is swollen (abdominal distention).  You are nauseated or vomiting.  You have a temperature over 101.  You have abdominal pain or discomfort that is severe or gets worse throughout the day.   Your colonoscopy revealed 2 polyp(s) which I removed successfully. One was > 1 cm.  Await pathology results, my office will contact you. I recommend repeating colonoscopy in 3 years for surveillance purposes. Otherwise follow up with GI as needed.    I hope you have a great rest of your week!  Elon Alas. Abbey Chatters, D.O. Gastroenterology and Hepatology [facility]

## 2021-09-01 NOTE — H&P (Signed)
Primary Care Physician:  Neale Burly, MD Primary Gastroenterologist:  Dr. Abbey Chatters  Pre-Procedure History & Physical: HPI:  Edgar Myers. is a 51 y.o. male is here for first ever colonoscopy for colon cancer screening purposes.  Patient denies any family history of colorectal cancer.  No melena or hematochezia.  No abdominal pain or unintentional weight loss.  No change in bowel habits.  Overall feels well from a GI standpoint.  Past Medical History:  Diagnosis Date   Diabetes mellitus without complication ([place])    pre-diabetic   Gout    Hypertension    Morbid obesity ([place])    PONV (postoperative nausea and vomiting)    Sleep apnea    Umbilical hernia     Past Surgical History:  Procedure Laterality Date   KNEE ARTHROSCOPY      Prior to Admission medications   Medication Sig Start Date End Date Taking? Authorizing Provider  aspirin EC 81 MG tablet Take 1 tablet (81 mg total) by mouth daily. Swallow whole. 06/05/21  Yes BranchAlphonse Guild, MD  cetirizine (ZYRTEC) 10 MG tablet Take 10 mg by mouth daily.   Yes [provider]  colchicine 0.6 MG tablet Take 0.6 mg by mouth daily as needed (gout).   Yes [provider]  fluticasone-salmeterol (ADVAIR DISKUS) 500-50 MCG/ACT AEPB Inhale 1 puff into the lungs in the morning and at bedtime. 05/29/21  Yes Hunsucker, Bonna Gains, MD  furosemide (LASIX) 40 MG tablet Take 40 mg by mouth every other day.   Yes [provider]  metFORMIN (GLUCOPHAGE) 500 MG tablet Take 500 mg by mouth 2 (two) times daily with a meal.   Yes [provider]  nebivolol (BYSTOLIC) 10 MG tablet Take 10 mg by mouth daily. 05/20/17  Yes [provider]  olmesartan (BENICAR) 40 MG tablet Take 40 mg by mouth daily. 11/04/20  Yes [provider]  polyethylene glycol-electrolytes (TRILYTE) 420 g solution Take 4,000 mLs by mouth as directed. 07/09/21  Yes Laron Boorman K, DO  simvastatin (ZOCOR) 10 MG tablet Take 10 mg  by mouth daily.   Yes [provider]  tirzepatide Darcel Bayley) 5 MG/0.5ML Pen Inject 5 mg into the skin every Sunday. 05/02/21  Yes [provider]    Allergies as of 07/09/2021   (No Known Allergies)    Family History  Problem Relation Age of Onset   Hyperlipidemia Mother    Hypertension Father    Colon cancer Neg Hx     Social History   Socioeconomic History   Marital status: Single    Spouse name: Not on file   Number of children: Not on file   Years of education: Not on file   Highest education level: Not on file  Occupational History   Not on file  Tobacco Use   Smoking status: Never   Smokeless tobacco: Never  Substance and Sexual Activity   Alcohol use: Yes    Comment: very little   Drug use: No   Sexual activity: Not on file  Other Topics Concern   Not on file  Social History Narrative   Not on file   Social Determinants of Health   Financial Resource Strain: Not on file  Food Insecurity: Not on file  Transportation Needs: Not on file  Physical Activity: Not on file  Stress: Not on file  Social Connections: Not on file  Intimate Partner Violence: Not on file    Review of Systems: See HPI, otherwise  negative ROS  Physical Exam: Vital signs in last 24 hours: Temp:  [98.2 F (36.8 C)] 98.2 F (36.8 C) (05/22 0656) Pulse Rate:  [66] 66 (05/22 0656) Resp:  [18] 18 (05/22 0656) BP: (140)/(70) 140/70 (05/22 0656) SpO2:  [98 %] 98 % (05/22 0656)   General:   Alert,  Well-developed, well-nourished, pleasant and cooperative in NAD Head:  Normocephalic and atraumatic. Eyes:  Sclera clear, no icterus.   Conjunctiva pink. Ears:  Normal auditory acuity. Nose:  No deformity, discharge,  or lesions. Mouth:  No deformity or lesions, dentition normal. Neck:  Supple; no masses or thyromegaly. Lungs:  Clear throughout to auscultation.   No wheezes, crackles, or rhonchi. No acute distress. Heart:  Regular rate and rhythm; no murmurs, clicks,  rubs,  or gallops. Abdomen:  Soft, nontender and nondistended. No masses, hepatosplenomegaly or hernias noted. Normal bowel sounds, without guarding, and without rebound.   Msk:  Symmetrical without gross deformities. Normal posture. Extremities:  Without clubbing or edema. Neurologic:  Alert and  oriented x4;  grossly normal neurologically. Skin:  Intact without significant lesions or rashes. Cervical Nodes:  No significant cervical adenopathy. Psych:  Alert and cooperative. Normal mood and affect.  Impression/Plan: Edgar Myers. is here for a colonoscopy to be performed for colon cancer screening purposes.  The risks of the procedure including infection, bleed, or perforation as well as benefits, limitations, alternatives and imponderables have been reviewed with the patient. Questions have been answered. All parties agreeable.

## 2021-09-02 LAB — SURGICAL PATHOLOGY

## 2021-09-03 ENCOUNTER — Telehealth: Payer: Self-pay | Admitting: Internal Medicine

## 2021-09-03 NOTE — Telephone Encounter (Signed)
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  We will need to repeat colonoscopy in 3 years as we discussed postoperatively.  Follow-up with GI as needed.  Thank you

## 2021-09-03 NOTE — Telephone Encounter (Signed)
Edgar Myers please NIC for 3 year follow up colonoscopy   Pt mailed his result letter

## 2021-09-04 ENCOUNTER — Encounter: Payer: 59 | Attending: General Surgery | Admitting: Skilled Nursing Facility1

## 2021-09-04 ENCOUNTER — Encounter: Payer: Self-pay | Admitting: Skilled Nursing Facility1

## 2021-09-04 NOTE — Progress Notes (Signed)
Supervised Weight Loss Visit Bariatric Nutrition Education   Sleeve Gastrectomy  3 out of 3 SWL Appointments    Pt completed visits.   Pt has cleared nutrition requirements.    NUTRITION ASSESSMENT  Anthropometrics  Start weight at [facility]: 468.3 lbs (date: 06/09/2021)  Weight: 474 pounds BMI: 70.00 kg/m2     Clinical  Medical hx: HTN, DM, gout Medications: see list  Labs: creatinine 1.45, lipase 122 Notable signs/symptoms: headaches multiple times a week Any previous deficiencies? No  Lifestyle & Dietary Hx  Pt states he was working on increasing his water intake and feels he has done well with this and has cut out sweet tea completley. Pt states he also cut out the slim jims.  Pt states eating non starchy vegetables has still been a challenge  Pt states he loves corn, squash, green beans, broccoli, cauliflower, cucumber, tomato, onions, mushroom.  Pt states he will pass on the vegetables even when they are offered and they are ones he likes.   Pt states he has done really well with: picking up his exercise, drinking more water and less caloric beverages in the last few months.    Pt states he will have to focus on: continuing with water, eating non starchy vegetables.   Estimated daily fluid intake:  oz Supplements:  Current average weekly physical activity: 30 minutes walking 7 days a week  24-Hr Dietary Recall First Meal: oatmeal + banana or cereal or eggs and fruit or eggs and bacon Snack: sometimes yogurt or pork rinds or slim jims Second Meal: sandwich on wheat with [place]  +french fries or chicken and fruit Snack:  Third Meal: baked pork chop and collards or chicken + mac n cheese + fried okra or salmon and salad Snack:  Beverages: water  Estimated Energy Needs Calories: 1800   NUTRITION DIAGNOSIS  Overweight/obesity ([place]-3.3) related to past poor dietary habits and physical inactivity as evidenced by patient w/ planned sleeve gastrectomy surgery following  dietary guidelines for continued weight loss.   NUTRITION INTERVENTION  Nutrition counseling (C-1) and education (E-2) to facilitate bariatric surgery goals.  Pre-Op Goals Progress & New Goals Continue: reduce fast food Continue: eat 3 meals a day Continue: walking most days of the week continue: cut out sweet tea Continue: read the label for sweets and follow the serving size to help make mindful decisions  continue: reduce the slim jims overall continue: add a non starchy vegetables to lunch every day NEW: eat the non starchy vegetable before eating the other foods   Learning Style & Readiness for Change Teaching method utilized: Visual & Auditory  Demonstrated degree of understanding via: Teach Back  Readiness Level: contemplative/some action Barriers to learning/adherence to lifestyle change: swing shift  RD's Notes for next Visit  Assess pts adherence to chosen goals    MONITORING & EVALUATION Dietary intake, weekly physical activity, body weight, and pre-op goals   Next Steps  Patient is to return to [facility] for pre-op class Pt has completed visits. No further supervised visits required/recomended

## 2021-09-09 ENCOUNTER — Encounter (HOSPITAL_COMMUNITY): Payer: Self-pay | Admitting: Internal Medicine

## 2021-09-16 ENCOUNTER — Telehealth: Payer: Self-pay | Admitting: Internal Medicine

## 2021-09-16 ENCOUNTER — Telehealth: Payer: Self-pay

## 2021-09-16 NOTE — Telephone Encounter (Signed)
Pt's wife is calling about the pt's result of his procedure. Just send it to me and I will call them

## 2021-09-16 NOTE — Telephone Encounter (Signed)
Patient wife called asking about patient results from his procedure

## 2021-09-16 NOTE — Telephone Encounter (Signed)
Returned the call and no ans/vm

## 2021-09-17 NOTE — Telephone Encounter (Signed)
Pt's wife phoned wanting results of the pt's procedure. Please advise

## 2021-09-17 NOTE — Telephone Encounter (Signed)
I addressed these on 09/03/2021 see below:  Please let patient know with phone call or letter that the polyps removed were sessile serrated adenomas..  We will need to repeat colonoscopy in 3 years as we discussed postoperatively.  Follow-up with GI as needed.  Thank you

## 2021-09-19 NOTE — Telephone Encounter (Signed)
Returned the pt's call and vm was full.

## 2021-09-22 NOTE — Telephone Encounter (Signed)
Pt's result letter mailed to him

## 2021-09-24 ENCOUNTER — Telehealth: Payer: Self-pay | Admitting: Internal Medicine

## 2021-09-24 NOTE — Telephone Encounter (Signed)
Edgar Harman, DO to Me   CC   09/17/21  4:20 PM Note I addressed these on 09/03/2021 see below:   Please let patient know with phone call or letter that the polyps removed were sessile serrated adenomas..  We will need to repeat colonoscopy in 3 years as we discussed postoperatively.  Follow-up with GI as needed.  Thank you

## 2021-09-24 NOTE — Telephone Encounter (Signed)
Edgar Myers please NIC for a 3 year colonoscopy.   Pt's wife was called back and given the results of colonoscopy and also advised her that I also mailed a letter. Understanding was expressed.

## 2021-09-24 NOTE — Telephone Encounter (Signed)
Noted  

## 2021-09-24 NOTE — Telephone Encounter (Signed)
Please call patient wife with patient results  [phone] Charmaine.  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

## 2021-09-24 NOTE — Telephone Encounter (Signed)
On recall  °

## 2021-11-13 ENCOUNTER — Ambulatory Visit: Payer: Self-pay | Admitting: General Surgery

## 2021-11-24 ENCOUNTER — Encounter: Payer: 59 | Attending: General Surgery | Admitting: Skilled Nursing Facility1

## 2021-11-24 ENCOUNTER — Encounter: Payer: Self-pay | Admitting: Skilled Nursing Facility1

## 2021-11-24 NOTE — Progress Notes (Signed)
Pre-Operative Nutrition Class:    Patient was seen on 11/24/2021 for Pre-Operative Bariatric Surgery Education at [facility].    Surgery date: 12/08/2021 Surgery type: sleeve Start weight at [facility]: 468.3 Weight today: 468.7  Samples given per MNT protocol. Patient educated on appropriate usage:   Bariatric Advantage Multivitamin Lot # [id number] Exp: 08/24   Bariatric Advantage Calcium  Lot # [id number] Exp: 07/23/2022   Protein Shake Lot # [id number] / [id number] Exp: 11 Jan 2022 /  13 Apr 2022  The following the learning objectives were met by the patient during this course: Identify Pre-Op Dietary Goals and will begin 2 weeks pre-operatively Identify appropriate sources of fluids and proteins  State protein recommendations and appropriate sources pre and post-operatively Identify Post-Operative Dietary Goals and will follow for 2 weeks post-operatively Identify appropriate multivitamin and calcium sources Describe the need for physical activity post-operatively and will follow MD recommendations State when to call healthcare provider regarding medication questions or post-operative complications When having a diagnosis of diabetes understanding hypoglycemia symptoms and the inclusion of 1 complex carbohydrate per meal  Handouts given during class include: Pre-Op Bariatric Surgery Diet Handout Protein Shake Handout Post-Op Bariatric Surgery Nutrition Handout BELT Program Information Flyer Support Group Information Flyer [facility] Bariatric Supplements Price List  Follow-Up Plan: Patient will follow-up at [facility] 2 weeks post operatively for diet advancement per MD.

## 2021-11-25 NOTE — Progress Notes (Signed)
Sent message, via epic in basket, requesting orders in epic from surgeon.  

## 2021-11-27 ENCOUNTER — Ambulatory Visit: Payer: Self-pay | Admitting: General Surgery

## 2021-11-27 DIAGNOSIS — E119 Type 2 diabetes mellitus without complications: Secondary | ICD-10-CM

## 2021-11-27 NOTE — H&P (Signed)
PROVIDER: Liah Morr Leanne Chang, MD  MRN: [id number] DOB: 1971-02-04 DATE OF ENCOUNTER: 11/13/2021 Subjective   Chief Complaint: Return Weight Loss (Pre - Op )   History of Present Illness: Edgar Myers. is a 51 y.o. male who is seen today for long-term follow-up regarding his severe obesity, diabetes mellitus type 2, struct of sleep apnea, umbilical hernia, gout, hypertension, coronary artery disease, chronic midline low back pain with sciatica, and hepatic steatosis. I last saw him in March. We had placed him on Mounjaro for diabetes control as well as weight loss. He states that for the most part he has been taking it weekly. He has had some intermittent interruptions because he underwent a colonoscopy and he did have some trouble with insurance. No side effects other than constipation. There wise he denies any medical changes since I last saw him. No chest pain or chest pressure. No shortness of breath. No reflux. No abdominal pain. No fever or chills.  He also had an echocardiogram since I last saw him.  Review of Systems: A complete review of systems was obtained from the patient. I have reviewed this information and discussed as appropriate with the patient. See HPI as well for other ROS.  ROS   Medical History: Past Medical History:  Diagnosis Date  Arthritis  Diabetes mellitus without complication (CMS-HCC)  Gout  Hypertension  Sleep apnea   There is no problem list on file for this patient.  History reviewed. No pertinent surgical history.   No Known Allergies  Current Outpatient Medications on File Prior to Visit  Medication Sig Dispense Refill  metFORMIN (GLUCOPHAGE-XR) 500 MG XR tablet 1 tablet with evening meal  nebivoloL (BYSTOLIC) 10 MG tablet Take 10 mg by mouth once daily  nebivoloL (BYSTOLIC) 10 MG tablet 1 tablet   No current facility-administered medications on file prior to visit.   Family History  Problem Relation Age of Onset  Hyperlipidemia  (Elevated cholesterol) Mother  High blood pressure (Hypertension) Father    Social History   Tobacco Use  Smoking Status Never  Smokeless Tobacco Never    Social History   Socioeconomic History  Marital status: Married  Tobacco Use  Smoking status: Never  Smokeless tobacco: Never  Substance and Sexual Activity  Alcohol use: Never  Drug use: Never   Objective:   Vitals:  11/13/21 0918  BP: (!) 140/70  Pulse: 95  Temp: 36.4 C (97.6 F)  SpO2: 97%  Weight: (!) 214.9 kg (473 lb 12.8 oz)  Height: 177.8 cm ('5\' 10"'$ )   Body mass index is 67.98 kg/m.  Gen: alert, NAD, non-toxic appearing; severe truncal obesity Pupils: equal, no scleral icterus Pulm: Lungs clear to auscultation, symmetric chest rise CV: regular rate and rhythm Abd: soft, nontender, nondistended.No cellulitis. Moderate umbilical hernia-chronically incarcerated Ext: no edema,  Skin: no rash, no jaundice  Labs, Imaging and Diagnostic Testing: Labs September 23, 2021 Done at [facility] internal medicine CBC normal-hemoglobin 14.3, hematocrit 43.6, platelet count 244; TSH 2; Lipid panel total cholesterol 185, triglycerides 101, HDL 51, LDL 116 Hemoglobin A1c 7.5 Comprehensive metabolic panel normal. BUN 12, creatinine 1.2 PSA 0.9 Vitamin D level low at 13.7  Echo 07/01/21 1. Left ventricular ejection fraction, by estimation, is 55 to 60%. The  left ventricle has normal function. Left ventricular endocardial border  not optimally defined to evaluate regional wall motion. There is moderate  left ventricular hypertrophy. Left  ventricular diastolic parameters are indeterminate.  2. Right ventricular systolic function is normal. The right  ventricular  size is mildly enlarged. There is normal pulmonary artery systolic  pressure. The estimated right ventricular systolic pressure is 31.5 mmHg.  3. The mitral valve is grossly normal. Trivial mitral valve  regurgitation.  4. The aortic valve is tricuspid. Aortic  valve regurgitation is not  visualized. No aortic stenosis is present. Aortic valve mean gradient  measures 4.0 mmHg.  5. The inferior vena cava is normal in size with <50% respiratory  variability, suggesting right atrial pressure of 8 mmHg.   Colonoscopy 5/23  Labs July 2022 Total cholesterol 164, triglycerides 75, HDL 45, LDL 400, basic metabolic panel mostly normal except creatinine 1.31 with a GFR of 66. Hemoglobin A1c 7, PSA 1.2  UGI 2/23 - nml CXR 2/23 - nml  Assessment and Plan:  Diagnoses and all orders for this visit:  Severe obesity (CMS-HCC)  Diabetes mellitus type 2 in obese (CMS-HCC)  Hypertension, essential  OSA (obstructive sleep apnea)  Chronic midline low back pain with bilateral sciatica  Umbilical hernia without obstruction and without gangrene  Gout of multiple sites, unspecified cause, unspecified chronicity    He has completed the bariatric surgery evaluation process. He has received nutritional and psychological evaluation. He also received cardiology evaluation and risk assessment. While his weight really has not decreased preoperatively I think his labs and other work-up is reassuring so I think it safe to proceed. We did discuss that his surgery would be a little more technically challenging because of his central truncal obesity. Therefore we will start him on the preoperative meal plan a little bit sooner to try to help with additional liver shrinkage. He has his preoperative education class next week. We rediscussed the typical hospitalization and the typical Recovery. Discussed the typical issues that we see immediately after surgery. We discussed risk of blood clots perioperatively and 30 days after surgery. We discussed that more than likely he would fall into the category needing extended chemical VTE prophylaxis after surgery because of his increased risk. We discussed this would involve going home on Lovenox therapy. He reviewed the surgical  consent form and signed today. We reviewed his upper GI, echocardiogram, labs together.  He will continue Mounjaro but stop it 1 week prior to surgery. We discussed the rationale for stopping Mounjaro 1 week prior to surgery.  This patient encounter took 35 minutes today to perform the following: take history, perform exam, review outside records, interpret imaging, counsel the patient on their diagnosis and document encounter, findings & plan in the EHR  No follow-ups on file.  [name]. [name] MD FACS General, Minimally Invasive, & Bariatric Surgery Electronically signed by Rudean Curt, MD at 11/13/2021 11:07 AM EDT

## 2021-11-27 NOTE — H&P (View-Only) (Signed)
PROVIDER: Suriyah Vergara Leanne Chang, MD  MRN: [id number] DOB: [birth year] DATE OF ENCOUNTER: 11/13/2021 Subjective   Chief Complaint: Return Weight Loss (Pre - Op )   History of Present Illness: Edgar Myers. is a 51 y.o. male who is seen today for long-term follow-up regarding his severe obesity, diabetes mellitus type 2, struct of sleep apnea, umbilical hernia, gout, hypertension, coronary artery disease, chronic midline low back pain with sciatica, and hepatic steatosis. I last saw him in March. We had placed him on Mounjaro for diabetes control as well as weight loss. He states that for the most part he has been taking it weekly. He has had some intermittent interruptions because he underwent a colonoscopy and he did have some trouble with insurance. No side effects other than constipation. There wise he denies any medical changes since I last saw him. No chest pain or chest pressure. No shortness of breath. No reflux. No abdominal pain. No fever or chills.  He also had an echocardiogram since I last saw him.  Review of Systems: A complete review of systems was obtained from the patient. I have reviewed this information and discussed as appropriate with the patient. See HPI as well for other ROS.  ROS   Medical History: Past Medical History:  Diagnosis Date  Arthritis  Diabetes mellitus without complication (CMS-HCC)  Gout  Hypertension  Sleep apnea   There is no problem list on file for this patient.  History reviewed. No pertinent surgical history.   No Known Allergies  Current Outpatient Medications on File Prior to Visit  Medication Sig Dispense Refill  metFORMIN (GLUCOPHAGE-XR) 500 MG XR tablet 1 tablet with evening meal  nebivoloL (BYSTOLIC) 10 MG tablet Take 10 mg by mouth once daily  nebivoloL (BYSTOLIC) 10 MG tablet 1 tablet   No current facility-administered medications on file prior to visit.   Family History  Problem Relation Age of Onset  Hyperlipidemia  (Elevated cholesterol) Mother  High blood pressure (Hypertension) Father    Social History   Tobacco Use  Smoking Status Never  Smokeless Tobacco Never    Social History   Socioeconomic History  Marital status: Married  Tobacco Use  Smoking status: Never  Smokeless tobacco: Never  Substance and Sexual Activity  Alcohol use: Never  Drug use: Never   Objective:   Vitals:  11/13/21 0918  BP: (!) 140/70  Pulse: 95  Temp: 36.4 C (97.6 F)  SpO2: 97%  Weight: (!) 214.9 kg (473 lb 12.8 oz)  Height: 177.8 cm ('5\' 10"'$ )   Body mass index is 67.98 kg/m.  Gen: alert, NAD, non-toxic appearing; severe truncal obesity Pupils: equal, no scleral icterus Pulm: Lungs clear to auscultation, symmetric chest rise CV: regular rate and rhythm Abd: soft, nontender, nondistended.No cellulitis. Moderate umbilical hernia-chronically incarcerated Ext: no edema,  Skin: no rash, no jaundice  Labs, Imaging and Diagnostic Testing: Labs September 23, 2021 Done at [facility] internal medicine CBC normal-hemoglobin 14.3, hematocrit 43.6, platelet count 244; TSH 2; Lipid panel total cholesterol 185, triglycerides 101, HDL 51, LDL 116 Hemoglobin A1c 7.5 Comprehensive metabolic panel normal. BUN 12, creatinine 1.2 PSA 0.9 Vitamin D level low at 13.7  Echo 07/01/21 1. Left ventricular ejection fraction, by estimation, is 55 to 60%. The  left ventricle has normal function. Left ventricular endocardial border  not optimally defined to evaluate regional wall motion. There is moderate  left ventricular hypertrophy. Left  ventricular diastolic parameters are indeterminate.  2. Right ventricular systolic function is normal. The right  ventricular  size is mildly enlarged. There is normal pulmonary artery systolic  pressure. The estimated right ventricular systolic pressure is 08.6 mmHg.  3. The mitral valve is grossly normal. Trivial mitral valve  regurgitation.  4. The aortic valve is tricuspid. Aortic  valve regurgitation is not  visualized. No aortic stenosis is present. Aortic valve mean gradient  measures 4.0 mmHg.  5. The inferior vena cava is normal in size with <50% respiratory  variability, suggesting right atrial pressure of 8 mmHg.   Colonoscopy 5/23  Labs July 2022 Total cholesterol 164, triglycerides 75, HDL 45, LDL 761, basic metabolic panel mostly normal except creatinine 1.31 with a GFR of 66. Hemoglobin A1c 7, PSA 1.2  UGI 2/23 - nml CXR 2/23 - nml  Assessment and Plan:  Diagnoses and all orders for this visit:  Severe obesity (CMS-HCC)  Diabetes mellitus type 2 in obese (CMS-HCC)  Hypertension, essential  OSA (obstructive sleep apnea)  Chronic midline low back pain with bilateral sciatica  Umbilical hernia without obstruction and without gangrene  Gout of multiple sites, unspecified cause, unspecified chronicity    He has completed the bariatric surgery evaluation process. He has received nutritional and psychological evaluation. He also received cardiology evaluation and risk assessment. While his weight really has not decreased preoperatively I think his labs and other work-up is reassuring so I think it safe to proceed. We did discuss that his surgery would be a little more technically challenging because of his central truncal obesity. Therefore we will start him on the preoperative meal plan a little bit sooner to try to help with additional liver shrinkage. He has his preoperative education class next week. We rediscussed the typical hospitalization and the typical Recovery. Discussed the typical issues that we see immediately after surgery. We discussed risk of blood clots perioperatively and 30 days after surgery. We discussed that more than likely he would fall into the category needing extended chemical VTE prophylaxis after surgery because of his increased risk. We discussed this would involve going home on Lovenox therapy. He reviewed the surgical  consent form and signed today. We reviewed his upper GI, echocardiogram, labs together.  He will continue Mounjaro but stop it 1 week prior to surgery. We discussed the rationale for stopping Mounjaro 1 week prior to surgery.  This patient encounter took 35 minutes today to perform the following: take history, perform exam, review outside records, interpret imaging, counsel the patient on their diagnosis and document encounter, findings & plan in the EHR  No follow-ups on file.  [name]. [name] MD FACS General, Minimally Invasive, & Bariatric Surgery Electronically signed by Rudean Curt, MD at 11/13/2021 11:07 AM EDT

## 2021-11-29 NOTE — Patient Instructions (Addendum)
DUE TO SPACE LIMITATIONS, ONLY TWO VISITORS  (aged 52 and older) ARE ALLOWED TO COME WITH YOU AND STAY IN THE WAITING ROOM DURING YOUR PRE OP AND PROCEDURE.   **NO VISITORS ARE ALLOWED IN THE SHORT STAY AREA OR RECOVERY ROOM!!**  IF YOU WILL BE ADMITTED INTO THE HOSPITAL YOU ARE ALLOWED ONLY FOUR SUPPORT PEOPLE DURING VISITATION HOURS (7 AM -8PM)   The support person(s) must pass our screening, and use Hand sanitizing gel. Visitors GUEST BADGE MUST BE WORN VISIBLY  One adult visitor may remain with you overnight and MUST be in the room by 8 P.M.   You are not required to quarantine at this time prior to your surgery. However, you must do this: Hand Hygiene often Do NOT share personal items Notify your provider if you are in close contact with someone who has COVID or you develop fever 100.4 or greater, new onset of sneezing, cough, sore throat, shortness of breath or body aches.       Your procedure is scheduled on:  Monday December 08, 2021  Report to [facility] Main Entrance.  Report to admitting at: 11:00 AM  +++++Call this number if you have any questions or problems the morning of surgery [phone]  Do not eat food :After Midnight the night prior to your surgery/procedure.  After Midnight you may have the following liquids until    10:15     AM DAY OF SURGERY  Clear Liquid Diet Water Black Coffee (sugar ok, NO MILK/CREAM OR CREAMERS)  Tea (sugar ok, NO MILK/CREAM OR CREAMERS) regular and decaf                             Plain Jell-O (NO RED)                                           Fruit ices (not with fruit pulp, NO RED)                                     Popsicles (NO RED)                                                                  Juice: apple, WHITE grape, WHITE cranberry Sports drinks like Gatorade (NO RED)                   The day of surgery:  Drink ONE (1) Pre-Surgery G2 at    10:15    AM the morning of surgery. Drink in one sitting. Do not sip.   This drink was given to you during your hospital pre-op appointment visit. Nothing else to drink after completing the Pre-Surgery  G2.    FOLLOW BOWEL PREP AND ANY ADDITIONAL PRE OP INSTRUCTIONS YOU RECEIVED FROM YOUR SURGEON'S OFFICE!!!   Oral Hygiene is also important to reduce your risk of infection.        Remember - BRUSH YOUR TEETH THE MORNING OF SURGERY WITH YOUR REGULAR TOOTHPASTE  Take ONLY these medicines the  morning of surgery with A SIP OF WATER: Nebivolol   Bring CPAP mask and tubing day of surgery.                   You may not have any metal on your body including jewelry, and body piercing  Do not wear lotions, powders, cologne, or deodorant  Men may shave face and neck.  You may bring a small overnight bag with you on the day of surgery, only pack items that are not valuable .[facility] IS NOT RESPONSIBLE   FOR VALUABLES THAT ARE LOST OR STOLEN.   DO NOT [place]. PHARMACY WILL DISPENSE MEDICATIONS LISTED ON YOUR MEDICATION LIST TO YOU DURING YOUR ADMISSION [place]!   Patients discharged on the day of surgery will not be allowed to drive home.  Someone NEEDS to stay with you for the first 24 hours after anesthesia.  Special Instructions: Bring a copy of your healthcare power of attorney and living will documents the day of surgery, if you wish to have them scanned into your [facility]- EPIC  Please read over the following fact sheets you were given: IF YOU HAVE QUESTIONS ABOUT YOUR PRE-OP INSTRUCTIONS, PLEASE CALL [phone]  ([place])   [facility] - Preparing for Surgery Before surgery, you can play an important role.  Because skin is not sterile, your skin needs to be as free of germs as possible.  You can reduce the number of germs on your skin by washing with CHG (chlorahexidine gluconate) soap before surgery.  CHG is an antiseptic cleaner which kills germs and bonds with the skin to continue killing  germs even after washing. Please DO NOT use if you have an allergy to CHG or antibacterial soaps.  If your skin becomes reddened/irritated stop using the CHG and inform your nurse when you arrive at Short Stay. Do not shave (including legs and underarms) for at least 48 hours prior to the first CHG shower.  You may shave your face/neck.  Please follow these instructions carefully:  1.  Shower with CHG Soap the night before surgery and the  morning of surgery.  2.  If you choose to wash your hair, wash your hair first as usual with your normal  shampoo.  3.  After you shampoo, rinse your hair and body thoroughly to remove the shampoo.                             4.  Use CHG as you would any other liquid soap.  You can apply chg directly to the skin and wash.  Gently with a scrungie or clean washcloth.  5.  Apply the CHG Soap to your body ONLY FROM THE NECK DOWN.   Do not use on face/ open                           Wound or open sores. Avoid contact with eyes, ears mouth and genitals (private parts).                       Wash face,  Genitals (private parts) with your normal soap.             6.  Wash thoroughly, paying special attention to the area where your  surgery  will be performed.  7.  Thoroughly rinse your  body with warm water from the neck down.  8.  DO NOT shower/wash with your normal soap after using and rinsing off the CHG Soap.            9.  Pat yourself dry with a clean towel.            10.  Wear clean pajamas.            11.  Place clean sheets on your bed the night of your first shower and do not  sleep with pets.  ON THE DAY OF SURGERY : Do not apply any lotions/deodorants the morning of surgery.  Please wear clean clothes to the hospital/surgery center.    FAILURE TO FOLLOW THESE INSTRUCTIONS MAY RESULT IN THE CANCELLATION OF YOUR SURGERY  PATIENT SIGNATURE_________________________________  NURSE  SIGNATURE__________________________________  ________________________________________________________________________    Edgar Myers    An incentive spirometer is a tool that can help keep your lungs clear and active. This tool measures how well you are filling your lungs with each breath. Taking long deep breaths may help reverse or decrease the chance of developing breathing (pulmonary) problems (especially infection) following: A long period of time when you are unable to move or be active. BEFORE THE PROCEDURE  If the spirometer includes an indicator to show your best effort, your nurse or respiratory therapist will set it to a desired goal. If possible, sit up straight or lean slightly forward. Try not to slouch. Hold the incentive spirometer in an upright position. INSTRUCTIONS FOR USE  Sit on the edge of your bed if possible, or sit up as far as you can in bed or on a chair. Hold the incentive spirometer in an upright position. Breathe out normally. Place the mouthpiece in your mouth and seal your lips tightly around it. Breathe in slowly and as deeply as possible, raising the piston or the ball toward the top of the column. Hold your breath for 3-5 seconds or for as long as possible. Allow the piston or ball to fall to the bottom of the column. Remove the mouthpiece from your mouth and breathe out normally. Rest for a few seconds and repeat Steps 1 through 7 at least 10 times every 1-2 hours when you are awake. Take your time and take a few normal breaths between deep breaths. The spirometer may include an indicator to show your best effort. Use the indicator as a goal to work toward during each repetition. After each set of 10 deep breaths, practice coughing to be sure your lungs are clear. If you have an incision (the cut made at the time of surgery), support your incision when coughing by placing a pillow or rolled up towels firmly against it. Once you are able to get  out of bed, walk around indoors and cough well. You may stop using the incentive spirometer when instructed by your caregiver.  RISKS AND COMPLICATIONS Take your time so you do not get dizzy or light-headed. If you are in pain, you may need to take or ask for pain medication before doing incentive spirometry. It is harder to take a deep breath if you are having pain. AFTER USE Rest and breathe slowly and easily. It can be helpful to keep track of a log of your progress. Your caregiver can provide you with a simple table to help with this. If you are using the spirometer at home, follow these instructions: [place] IF:  You are having difficultly using the  spirometer. You have trouble using the spirometer as often as instructed. Your pain medication is not giving enough relief while using the spirometer. You develop fever of 100.5 F (38.1 C) or higher.                                                                                                    SEEK IMMEDIATE MEDICAL CARE IF:  You cough up bloody sputum that had not been present before. You develop fever of 102 F (38.9 C) or greater. You develop worsening pain at or near the incision site. MAKE SURE YOU:  Understand these instructions. Will watch your condition. Will get help right away if you are not doing well or get worse. Document Released: 08/10/2006 Document Revised: 06/22/2011 Document Reviewed: 10/11/2006 [facility] Patient Information 2014 [place], [place].

## 2021-11-29 NOTE — Progress Notes (Signed)
COVID Vaccine received:  '[]'$  No '[x]'$  Yes Date of any COVID positive Test in last 90 days: None  PCP - Stoney Bang, MD Cardiologist - Carlyle Dolly, MD       Cardiac clearance, see phone note 07-03-2021  Chest x-ray - 06-10-2021  Epic EKG - 03-04-2021  Epic  Stress Test - n/a ECHO - 07-01-21  Epic Cardiac Cath - n/a  Pacemaker/ICD device     '[x]'$  N/A Spinal Cord Stimulator:'[x]'$  No '[]'$  Yes   Other Implants:   Bowel Prep - clear fluids after 1800 day prior to surgery per Dr Dois Davenport prep according to patient.   History of Sleep Apnea? '[]'$  No '[x]'$  Yes   Sleep Study Date:   CPAP used?- '[]'$  No '[x]'$  Yes  (Instruct to bring their mask & Tubing)    patient is somewhat compliant with CPAP use.   Does the patient monitor blood sugar? '[x]'$  No '[]'$  Yes  '[]'$  N/A Fasting Blood Sugar Ranges-  Checks Blood Sugar __0___ times a day  Blood Thinner Instructions: Aspirin Instructions:  ASA 81 mg Last Dose:  ERAS Protocol Ordered: '[]'$  No  '[x]'$  Yes PRE-SURGERY '[]'$  ENSURE  '[x]'$  G2   Comments: BARIBED has been ordered for this patient  Activity level: Patient can climb a flight of stairs without difficulty; '[x]'$  No CP  but would have SOB,    Anesthesia review: OSA, hepatic steatosis, PONV, Pre-DM, HTN  Patient denies shortness of breath, fever, cough and chest pain at PAT appointment.  Patient verbalized understanding and agreement to the Pre-Surgical Instructions that were given to them at this PAT appointment. Patient was also educated of the need to review these PAT instructions again prior to his/her surgery.I reviewed the appropriate phone numbers to call if they have any and questions or concerns.

## 2021-12-02 ENCOUNTER — Encounter (HOSPITAL_COMMUNITY): Payer: Self-pay

## 2021-12-02 ENCOUNTER — Encounter (HOSPITAL_COMMUNITY)
Admission: RE | Admit: 2021-12-02 | Discharge: 2021-12-02 | Disposition: A | Discharge status: Home / Self Care | Payer: 59 | Source: Ambulatory Visit | Attending: General Surgery | Admitting: General Surgery

## 2021-12-02 ENCOUNTER — Other Ambulatory Visit: Payer: Self-pay

## 2021-12-02 VITALS — BP 148/93 | HR 60 | Temp 98.3°F | Resp 22 | Ht 70.0 in | Wt >= 6400 oz

## 2021-12-02 DIAGNOSIS — Z01818 Encounter for other preprocedural examination: Secondary | ICD-10-CM | POA: Diagnosis present

## 2021-12-02 DIAGNOSIS — E119 Type 2 diabetes mellitus without complications: Secondary | ICD-10-CM

## 2021-12-02 DIAGNOSIS — R7303 Prediabetes: Secondary | ICD-10-CM

## 2021-12-02 HISTORY — DX: Headache, unspecified: R51.9

## 2021-12-02 HISTORY — DX: Prediabetes: R73.03

## 2021-12-02 HISTORY — DX: Unspecified osteoarthritis, unspecified site: M19.90

## 2021-12-02 LAB — COMPREHENSIVE METABOLIC PANEL
ALT: 49 U/L — ABNORMAL HIGH (ref 0–44)
AST: 31 U/L (ref 15–41)
Albumin: 4.1 g/dL (ref 3.5–5.0)
Alkaline Phosphatase: 33 U/L — ABNORMAL LOW (ref 38–126)
Anion gap: 6 (ref 5–15)
BUN: 13 mg/dL (ref 6–20)
CO2: 28 mmol/L (ref 22–32)
Calcium: 9.3 mg/dL (ref 8.9–10.3)
Chloride: 105 mmol/L (ref 98–111)
Creatinine, Ser: 1.12 mg/dL (ref 0.61–1.24)
GFR, Estimated: >60 mL/min (ref >60)
Glucose, Bld: 102 mg/dL — ABNORMAL HIGH (ref 70–99)
Potassium: 4 mmol/L (ref 3.5–5.1)
Sodium: 139 mmol/L (ref 135–145)
Total Bilirubin: 0.7 mg/dL (ref 0.3–1.2)
Total Protein: 7.3 g/dL (ref 6.5–8.1)

## 2021-12-02 LAB — CBC WITH DIFFERENTIAL/PLATELET
Abs Immature Granulocytes: 0.01 10*3/uL (ref 0.00–0.07)
Basophils Absolute: 0 10*3/uL (ref 0.0–0.1)
Basophils Relative: 1 %
Eosinophils Absolute: 0.3 10*3/uL (ref 0.0–0.5)
Eosinophils Relative: 5 %
HCT: 44 % (ref 39.0–52.0)
Hemoglobin: 14.4 g/dL (ref 13.0–17.0)
Immature Granulocytes: 0 %
Lymphocytes Relative: 29 %
Lymphs Abs: 1.7 10*3/uL (ref 0.7–4.0)
MCH: 29.9 pg (ref 26.0–34.0)
MCHC: 32.7 g/dL (ref 30.0–36.0)
MCV: 91.5 fL (ref 80.0–100.0)
Monocytes Absolute: 0.7 10*3/uL (ref 0.1–1.0)
Monocytes Relative: 11 %
Neutro Abs: 3.2 10*3/uL (ref 1.7–7.7)
Neutrophils Relative %: 54 %
Platelets: 233 10*3/uL (ref 150–400)
RBC: 4.81 MIL/uL (ref 4.22–5.81)
RDW: 13.1 % (ref 11.5–15.5)
WBC: 6 10*3/uL (ref 4.0–10.5)
nRBC: 0 % (ref 0.0–0.2)

## 2021-12-02 LAB — HEMOGLOBIN A1C
Hgb A1c MFr Bld: 6.7 % — ABNORMAL HIGH (ref 4.8–5.6)
Mean Plasma Glucose: 145.59 mg/dL

## 2021-12-02 LAB — GLUCOSE, CAPILLARY: Glucose-Capillary: 118 mg/dL — ABNORMAL HIGH (ref 70–99)

## 2021-12-08 ENCOUNTER — Encounter (HOSPITAL_COMMUNITY): Payer: Self-pay | Admitting: General Surgery

## 2021-12-08 ENCOUNTER — Observation Stay (HOSPITAL_COMMUNITY)
Admission: RE | Admit: 2021-12-08 | Discharge: 2021-12-10 | Disposition: A | Discharge status: Home / Self Care | Payer: 59 | Source: Ambulatory Visit | Attending: General Surgery | Admitting: General Surgery

## 2021-12-08 ENCOUNTER — Ambulatory Visit (HOSPITAL_COMMUNITY): Payer: 59 | Admitting: Anesthesiology

## 2021-12-08 ENCOUNTER — Other Ambulatory Visit: Payer: Self-pay

## 2021-12-08 ENCOUNTER — Encounter (HOSPITAL_COMMUNITY)
Admission: RE | Disposition: A | Discharge status: Home / Self Care | Payer: Self-pay | Source: Ambulatory Visit | Attending: General Surgery

## 2021-12-08 DIAGNOSIS — M109 Gout, unspecified: Secondary | ICD-10-CM | POA: Insufficient documentation

## 2021-12-08 DIAGNOSIS — M5441 Lumbago with sciatica, right side: Secondary | ICD-10-CM | POA: Insufficient documentation

## 2021-12-08 DIAGNOSIS — I10 Essential (primary) hypertension: Secondary | ICD-10-CM

## 2021-12-08 DIAGNOSIS — Z7984 Long term (current) use of oral hypoglycemic drugs: Secondary | ICD-10-CM | POA: Insufficient documentation

## 2021-12-08 DIAGNOSIS — G4733 Obstructive sleep apnea (adult) (pediatric): Secondary | ICD-10-CM | POA: Insufficient documentation

## 2021-12-08 DIAGNOSIS — Z79899 Other long term (current) drug therapy: Secondary | ICD-10-CM | POA: Insufficient documentation

## 2021-12-08 DIAGNOSIS — I2510 Atherosclerotic heart disease of native coronary artery without angina pectoris: Secondary | ICD-10-CM | POA: Insufficient documentation

## 2021-12-08 DIAGNOSIS — Z9884 Bariatric surgery status: Principal | ICD-10-CM

## 2021-12-08 DIAGNOSIS — E119 Type 2 diabetes mellitus without complications: Secondary | ICD-10-CM | POA: Insufficient documentation

## 2021-12-08 DIAGNOSIS — M5442 Lumbago with sciatica, left side: Secondary | ICD-10-CM | POA: Insufficient documentation

## 2021-12-08 DIAGNOSIS — Z6841 Body Mass Index (BMI) 40.0 and over, adult: Secondary | ICD-10-CM | POA: Diagnosis not present

## 2021-12-08 DIAGNOSIS — R7303 Prediabetes: Secondary | ICD-10-CM

## 2021-12-08 DIAGNOSIS — K429 Umbilical hernia without obstruction or gangrene: Secondary | ICD-10-CM | POA: Insufficient documentation

## 2021-12-08 HISTORY — PX: UPPER GI ENDOSCOPY: SHX6162

## 2021-12-08 HISTORY — PX: LAPAROSCOPIC GASTRIC SLEEVE RESECTION: SHX5895

## 2021-12-08 LAB — CBC
HCT: 45.9 % (ref 39.0–52.0)
Hemoglobin: 15.4 g/dL (ref 13.0–17.0)
MCH: 30.1 pg (ref 26.0–34.0)
MCHC: 33.6 g/dL (ref 30.0–36.0)
MCV: 89.8 fL (ref 80.0–100.0)
Platelets: 230 10*3/uL (ref 150–400)
RBC: 5.11 MIL/uL (ref 4.22–5.81)
RDW: 13.1 % (ref 11.5–15.5)
WBC: 11.6 10*3/uL — ABNORMAL HIGH (ref 4.0–10.5)
nRBC: 0 % (ref 0.0–0.2)

## 2021-12-08 LAB — TYPE AND SCREEN
ABO/RH(D): B POS
Antibody Screen: NEGATIVE

## 2021-12-08 LAB — GLUCOSE, CAPILLARY
Glucose-Capillary: 125 mg/dL — ABNORMAL HIGH (ref 70–99)
Glucose-Capillary: 123 mg/dL — ABNORMAL HIGH (ref 70–99)
Glucose-Capillary: 162 mg/dL — ABNORMAL HIGH (ref 70–99)

## 2021-12-08 LAB — ABO/RH: ABO/RH(D): B POS

## 2021-12-08 SURGERY — GASTRECTOMY, SLEEVE, LAPAROSCOPIC
Anesthesia: General

## 2021-12-08 MED ORDER — PROPOFOL 10 MG/ML IV BOLUS
INTRAVENOUS | Status: DC | PRN
  Administered 2021-12-08: 200 mg via INTRAVENOUS
  Administered 2021-12-08: 40 mg via INTRAVENOUS

## 2021-12-08 MED ORDER — SODIUM CHLORIDE (PF) 0.9 % IJ SOLN
INTRAMUSCULAR | Status: DC | PRN
  Administered 2021-12-08: 30 mL

## 2021-12-08 MED ORDER — ACETAMINOPHEN 500 MG PO TABS
1000.0000 mg | ORAL_TABLET | ORAL | Status: AC
  Administered 2021-12-08: 1000 mg via ORAL
  Filled 2021-12-08: qty 2

## 2021-12-08 MED ORDER — SUGAMMADEX SODIUM 200 MG/2ML IV SOLN
INTRAVENOUS | Status: DC | PRN
  Administered 2021-12-08: 500 mg via INTRAVENOUS

## 2021-12-08 MED ORDER — NEBIVOLOL HCL 10 MG PO TABS
10.0000 mg | ORAL_TABLET | Freq: Every day | ORAL | Status: DC
  Administered 2021-12-09 – 2021-12-10 (×2): 10 mg via ORAL
  Filled 2021-12-08 (×2): qty 1

## 2021-12-08 MED ORDER — DEXAMETHASONE SODIUM PHOSPHATE 4 MG/ML IJ SOLN: 4.0000 mg | INTRAMUSCULAR | Status: DC

## 2021-12-08 MED ORDER — LACTATED RINGERS IR SOLN
Status: DC | PRN
  Administered 2021-12-08: 1000 mL

## 2021-12-08 MED ORDER — BUPIVACAINE LIPOSOME 1.3 % IJ SUSP
INTRAMUSCULAR | Status: DC | PRN
  Administered 2021-12-08: 20 mL

## 2021-12-08 MED ORDER — AMISULPRIDE (ANTIEMETIC) 5 MG/2ML IV SOLN
10.0000 mg | Freq: Once | INTRAVENOUS | Status: AC
Start: 2021-12-08 — End: 2021-12-08
  Administered 2021-12-08: 10 mg via INTRAVENOUS

## 2021-12-08 MED ORDER — MIDAZOLAM HCL 2 MG/2ML IJ SOLN
INTRAMUSCULAR | Status: AC
  Filled 2021-12-08: qty 2

## 2021-12-08 MED ORDER — HYDRALAZINE HCL 20 MG/ML IJ SOLN
10.0000 mg | INTRAMUSCULAR | Status: DC | PRN
  Administered 2021-12-08 – 2021-12-09 (×2): 10 mg via INTRAVENOUS
  Filled 2021-12-08 (×2): qty 1

## 2021-12-08 MED ORDER — SCOPOLAMINE 1 MG/3DAYS TD PT72
1.0000 | MEDICATED_PATCH | TRANSDERMAL | Status: DC
  Administered 2021-12-08: 1.5 mg via TRANSDERMAL
  Filled 2021-12-08: qty 1

## 2021-12-08 MED ORDER — SUGAMMADEX SODIUM 500 MG/5ML IV SOLN
INTRAVENOUS | Status: AC
  Filled 2021-12-08: qty 5

## 2021-12-08 MED ORDER — CHLORHEXIDINE GLUCONATE 0.12 % MT SOLN
15.0000 mL | Freq: Once | OROMUCOSAL | Status: AC
  Administered 2021-12-08: 15 mL via OROMUCOSAL

## 2021-12-08 MED ORDER — BUPIVACAINE LIPOSOME 1.3 % IJ SUSP
INTRAMUSCULAR | Status: AC
  Filled 2021-12-08: qty 20

## 2021-12-08 MED ORDER — BUPIVACAINE LIPOSOME 1.3 % IJ SUSP: 20.0000 mL | Freq: Once | INTRAMUSCULAR | Status: DC

## 2021-12-08 MED ORDER — OXYCODONE HCL 5 MG/5ML PO SOLN
5.0000 mg | Freq: Four times a day (QID) | ORAL | Status: DC | PRN
  Administered 2021-12-08: 5 mg via ORAL
  Filled 2021-12-08: qty 5

## 2021-12-08 MED ORDER — SODIUM CHLORIDE 0.9 % IV SOLN
2.0000 g | INTRAVENOUS | Status: AC
  Administered 2021-12-08: 2 g via INTRAVENOUS
  Filled 2021-12-08: qty 2

## 2021-12-08 MED ORDER — IRBESARTAN 150 MG PO TABS
300.0000 mg | ORAL_TABLET | Freq: Every day | ORAL | Status: DC
  Administered 2021-12-09 – 2021-12-10 (×2): 300 mg via ORAL
  Filled 2021-12-08 (×2): qty 2

## 2021-12-08 MED ORDER — EPHEDRINE SULFATE-NACL 50-0.9 MG/10ML-% IV SOSY
PREFILLED_SYRINGE | INTRAVENOUS | Status: DC | PRN
  Administered 2021-12-08: 5 mg via INTRAVENOUS
  Administered 2021-12-08 (×3): 10 mg via INTRAVENOUS

## 2021-12-08 MED ORDER — LIDOCAINE 2% (20 MG/ML) 5 ML SYRINGE
INTRAMUSCULAR | Status: DC | PRN
  Administered 2021-12-08: 100 mg via INTRAVENOUS

## 2021-12-08 MED ORDER — ENOXAPARIN SODIUM 30 MG/0.3ML IJ SOSY
30.0000 mg | PREFILLED_SYRINGE | Freq: Two times a day (BID) | INTRAMUSCULAR | Status: DC
Start: 2021-12-09 — End: 2021-12-10
  Administered 2021-12-09 – 2021-12-10 (×3): 30 mg via SUBCUTANEOUS
  Filled 2021-12-08 (×3): qty 0.3

## 2021-12-08 MED ORDER — PROPOFOL 10 MG/ML IV BOLUS
INTRAVENOUS | Status: AC
  Filled 2021-12-08: qty 20

## 2021-12-08 MED ORDER — ORAL CARE MOUTH RINSE: 15.0000 mL | Freq: Once | OROMUCOSAL | Status: AC

## 2021-12-08 MED ORDER — MIDAZOLAM HCL 5 MG/5ML IJ SOLN
INTRAMUSCULAR | Status: DC | PRN
  Administered 2021-12-08: 2 mg via INTRAVENOUS

## 2021-12-08 MED ORDER — DEXAMETHASONE SODIUM PHOSPHATE 10 MG/ML IJ SOLN
INTRAMUSCULAR | Status: DC | PRN
  Administered 2021-12-08: 10 mg via INTRAVENOUS

## 2021-12-08 MED ORDER — SUCCINYLCHOLINE CHLORIDE 200 MG/10ML IV SOSY
PREFILLED_SYRINGE | INTRAVENOUS | Status: DC | PRN
  Administered 2021-12-08: 180 mg via INTRAVENOUS

## 2021-12-08 MED ORDER — ACETAMINOPHEN 160 MG/5ML PO SOLN: 1000.0000 mg | Freq: Three times a day (TID) | ORAL | Status: DC

## 2021-12-08 MED ORDER — ONDANSETRON HCL 4 MG/2ML IJ SOLN
INTRAMUSCULAR | Status: AC
Start: 2021-12-08 — End: ?
  Filled 2021-12-08: qty 2

## 2021-12-08 MED ORDER — ENSURE MAX PROTEIN PO LIQD
2.0000 [oz_av] | ORAL | Status: DC
Start: 2021-12-09 — End: 2021-12-10
  Administered 2021-12-09 – 2021-12-10 (×9): 2 [oz_av] via ORAL

## 2021-12-08 MED ORDER — DEXAMETHASONE SODIUM PHOSPHATE 10 MG/ML IJ SOLN
INTRAMUSCULAR | Status: AC
Start: 2021-12-08 — End: ?
  Filled 2021-12-08: qty 1

## 2021-12-08 MED ORDER — 0.9 % SODIUM CHLORIDE (POUR BTL) OPTIME
TOPICAL | Status: DC | PRN
  Administered 2021-12-08: 1000 mL

## 2021-12-08 MED ORDER — CHLORHEXIDINE GLUCONATE 4 % EX LIQD: Freq: Once | CUTANEOUS | Status: DC

## 2021-12-08 MED ORDER — ONDANSETRON HCL 4 MG/2ML IJ SOLN
INTRAMUSCULAR | Status: DC | PRN
  Administered 2021-12-08: 4 mg via INTRAVENOUS

## 2021-12-08 MED ORDER — ONDANSETRON HCL 4 MG/2ML IJ SOLN
4.0000 mg | Freq: Four times a day (QID) | INTRAMUSCULAR | Status: DC | PRN
Start: 2021-12-08 — End: 2021-12-10

## 2021-12-08 MED ORDER — SIMETHICONE 80 MG PO CHEW: 80.0000 mg | CHEWABLE_TABLET | Freq: Four times a day (QID) | ORAL | Status: DC | PRN

## 2021-12-08 MED ORDER — INSULIN ASPART 100 UNIT/ML IJ SOLN
0.0000 [IU] | INTRAMUSCULAR | Status: DC
  Administered 2021-12-08: 4 [IU] via SUBCUTANEOUS
  Administered 2021-12-09 (×2): 3 [IU] via SUBCUTANEOUS
  Administered 2021-12-09: 4 [IU] via SUBCUTANEOUS
  Administered 2021-12-10: 3 [IU] via SUBCUTANEOUS

## 2021-12-08 MED ORDER — KETAMINE HCL 10 MG/ML IJ SOLN
INTRAMUSCULAR | Status: DC | PRN
  Administered 2021-12-08 (×2): 15 mg via INTRAVENOUS

## 2021-12-08 MED ORDER — LACTATED RINGERS IV SOLN: INTRAVENOUS | Status: DC

## 2021-12-08 MED ORDER — PANTOPRAZOLE SODIUM 40 MG IV SOLR
40.0000 mg | Freq: Every day | INTRAVENOUS | Status: DC
  Administered 2021-12-08 – 2021-12-09 (×2): 40 mg via INTRAVENOUS
  Filled 2021-12-08 (×2): qty 10

## 2021-12-08 MED ORDER — PHENYLEPHRINE 80 MCG/ML (10ML) SYRINGE FOR IV PUSH (FOR BLOOD PRESSURE SUPPORT)
PREFILLED_SYRINGE | INTRAVENOUS | Status: DC | PRN
  Administered 2021-12-08 (×2): 160 ug via INTRAVENOUS
  Administered 2021-12-08 (×2): 80 ug via INTRAVENOUS
  Administered 2021-12-08 (×2): 160 ug via INTRAVENOUS

## 2021-12-08 MED ORDER — FENTANYL CITRATE (PF) 250 MCG/5ML IJ SOLN
INTRAMUSCULAR | Status: AC
  Filled 2021-12-08: qty 5

## 2021-12-08 MED ORDER — AMISULPRIDE (ANTIEMETIC) 5 MG/2ML IV SOLN
INTRAVENOUS | Status: AC
  Filled 2021-12-08: qty 4

## 2021-12-08 MED ORDER — ROCURONIUM BROMIDE 10 MG/ML (PF) SYRINGE
PREFILLED_SYRINGE | INTRAVENOUS | Status: AC
Start: 2021-12-08 — End: ?
  Filled 2021-12-08: qty 10

## 2021-12-08 MED ORDER — ONDANSETRON HCL 4 MG/2ML IJ SOLN
INTRAMUSCULAR | Status: AC
  Filled 2021-12-08: qty 2

## 2021-12-08 MED ORDER — KETAMINE HCL 50 MG/5ML IJ SOSY
PREFILLED_SYRINGE | INTRAMUSCULAR | Status: AC
  Filled 2021-12-08: qty 5

## 2021-12-08 MED ORDER — FENTANYL CITRATE (PF) 100 MCG/2ML IJ SOLN
INTRAMUSCULAR | Status: DC | PRN
  Administered 2021-12-08: 50 ug via INTRAVENOUS
  Administered 2021-12-08: 100 ug via INTRAVENOUS

## 2021-12-08 MED ORDER — MORPHINE SULFATE (PF) 2 MG/ML IV SOLN: 1.0000 mg | INTRAVENOUS | Status: DC | PRN

## 2021-12-08 MED ORDER — EPHEDRINE 5 MG/ML INJ
INTRAVENOUS | Status: AC
  Filled 2021-12-08: qty 20

## 2021-12-08 MED ORDER — PROCHLORPERAZINE EDISYLATE 10 MG/2ML IJ SOLN: 10.0000 mg | Freq: Four times a day (QID) | INTRAMUSCULAR | Status: DC | PRN

## 2021-12-08 MED ORDER — KCL IN DEXTROSE-NACL 20-5-0.45 MEQ/L-%-% IV SOLN
INTRAVENOUS | Status: DC
  Filled 2021-12-08 (×2): qty 1000

## 2021-12-08 MED ORDER — ROCURONIUM BROMIDE 10 MG/ML (PF) SYRINGE
PREFILLED_SYRINGE | INTRAVENOUS | Status: AC
  Filled 2021-12-08: qty 40

## 2021-12-08 MED ORDER — ACETAMINOPHEN 500 MG PO TABS
1000.0000 mg | ORAL_TABLET | Freq: Three times a day (TID) | ORAL | Status: DC
  Administered 2021-12-08 – 2021-12-10 (×5): 1000 mg via ORAL
  Filled 2021-12-08 (×5): qty 2

## 2021-12-08 MED ORDER — HEPARIN SODIUM (PORCINE) 5000 UNIT/ML IJ SOLN
5000.0000 [IU] | INTRAMUSCULAR | Status: AC
  Administered 2021-12-08: 5000 [IU] via SUBCUTANEOUS
  Filled 2021-12-08: qty 1

## 2021-12-08 MED ORDER — PHENYLEPHRINE 80 MCG/ML (10ML) SYRINGE FOR IV PUSH (FOR BLOOD PRESSURE SUPPORT)
PREFILLED_SYRINGE | INTRAVENOUS | Status: AC
  Filled 2021-12-08: qty 30

## 2021-12-08 MED ORDER — HYDROMORPHONE HCL 1 MG/ML IJ SOLN
0.2500 mg | INTRAMUSCULAR | Status: DC | PRN
  Administered 2021-12-08: 0.25 mg via INTRAVENOUS
  Administered 2021-12-08: 0.5 mg via INTRAVENOUS

## 2021-12-08 MED ORDER — ROCURONIUM BROMIDE 10 MG/ML (PF) SYRINGE
PREFILLED_SYRINGE | INTRAVENOUS | Status: DC | PRN
  Administered 2021-12-08: 70 mg via INTRAVENOUS
  Administered 2021-12-08: 10 mg via INTRAVENOUS
  Administered 2021-12-08: 20 mg via INTRAVENOUS
  Administered 2021-12-08: 10 mg via INTRAVENOUS

## 2021-12-08 MED ORDER — STERILE WATER FOR IRRIGATION IR SOLN
Status: DC | PRN
  Administered 2021-12-08: 500 mL

## 2021-12-08 MED ORDER — HYDROMORPHONE HCL 1 MG/ML IJ SOLN
INTRAMUSCULAR | Status: AC
  Filled 2021-12-08: qty 1

## 2021-12-08 MED ORDER — APREPITANT 40 MG PO CAPS
40.0000 mg | ORAL_CAPSULE | ORAL | Status: AC
  Administered 2021-12-08: 40 mg via ORAL
  Filled 2021-12-08: qty 1

## 2021-12-08 SURGICAL SUPPLY — 85 items
APPLICATOR COTTON TIP 6 STRL (MISCELLANEOUS) IMPLANT
APPLICATOR COTTON TIP 6IN STRL (MISCELLANEOUS)
APPLIER CLIP 5 13 M/L LIGAMAX5 (MISCELLANEOUS) ×1
APPLIER CLIP ROT 10 11.4 M/L (STAPLE)
APPLIER CLIP ROT 13.4 12 LRG (CLIP)
BAG COUNTER SPONGE SURGICOUNT (BAG) IMPLANT
BAG LAPAROSCOPIC 12 15 PORT 16 (BASKET) IMPLANT
BAG RETRIEVAL 12/15 (BASKET) ×1
BLADE SURG SZ11 CARB STEEL (BLADE) ×1 IMPLANT
CABLE HIGH FREQUENCY MONO STRZ (ELECTRODE) IMPLANT
CHLORAPREP W/TINT 26 (MISCELLANEOUS) ×2 IMPLANT
CLIP APPLIE 5 13 M/L LIGAMAX5 (MISCELLANEOUS) IMPLANT
CLIP APPLIE ROT 10 11.4 M/L (STAPLE) IMPLANT
CLIP APPLIE ROT 13.4 12 LRG (CLIP) IMPLANT
CLSR STERI-STRIP ANTIMIC 1/2X4 (GAUZE/BANDAGES/DRESSINGS) IMPLANT
COVER SURGICAL LIGHT HANDLE (MISCELLANEOUS) ×1 IMPLANT
DEVICE SUT QUICK LOAD TK 5 (SUTURE) IMPLANT
DEVICE SUT TI-KNOT TK 5X26 (SUTURE) IMPLANT
DEVICE SUTURE ENDOST 10MM (ENDOMECHANICALS) IMPLANT
DISSECTOR BLUNT TIP ENDO 5MM (MISCELLANEOUS) IMPLANT
DRAPE UTILITY XL STRL (DRAPES) ×2 IMPLANT
DRSG TEGADERM 2-3/8X2-3/4 SM (GAUZE/BANDAGES/DRESSINGS) ×6 IMPLANT
ELECT L-HOOK LAP 45CM DISP (ELECTROSURGICAL)
ELECT REM PT RETURN 15FT ADLT (MISCELLANEOUS) ×1 IMPLANT
ELECTRODE L-HOOK LAP 45CM DISP (ELECTROSURGICAL) IMPLANT
GAUZE SPONGE 2X2 8PLY STRL LF (GAUZE/BANDAGES/DRESSINGS) IMPLANT
GAUZE SPONGE 4X4 12PLY STRL (GAUZE/BANDAGES/DRESSINGS) IMPLANT
GLOVE BIO SURGEON STRL SZ7.5 (GLOVE) ×1 IMPLANT
GLOVE INDICATOR 8.0 STRL GRN (GLOVE) ×1 IMPLANT
GOWN STRL REUS W/ TWL XL LVL3 (GOWN DISPOSABLE) ×3 IMPLANT
GOWN STRL REUS W/TWL XL LVL3 (GOWN DISPOSABLE) ×3
GRASPER SUT TROCAR 14GX15 (MISCELLANEOUS) ×1 IMPLANT
IRRIG SUCT STRYKERFLOW 2 WTIP (MISCELLANEOUS) ×1
IRRIGATION SUCT STRKRFLW 2 WTP (MISCELLANEOUS) ×1 IMPLANT
KIT BASIN OR (CUSTOM PROCEDURE TRAY) ×1 IMPLANT
KIT TURNOVER KIT A (KITS) IMPLANT
MARKER SKIN DUAL TIP RULER LAB (MISCELLANEOUS) ×1 IMPLANT
MAT PREVALON FULL STRYKER (MISCELLANEOUS) ×1 IMPLANT
NDL SPNL 22GX3.5 QUINCKE BK (NEEDLE) ×1 IMPLANT
NEEDLE SPNL 22GX3.5 QUINCKE BK (NEEDLE) ×1 IMPLANT
PACK UNIVERSAL I (CUSTOM PROCEDURE TRAY) ×1 IMPLANT
PENCIL SMOKE EVACUATOR (MISCELLANEOUS) IMPLANT
RELOAD STAPLE 60 2.6 WHT THN (STAPLE) IMPLANT
RELOAD STAPLE 60 3.6 BLU REG (STAPLE) ×1 IMPLANT
RELOAD STAPLE 60 3.8 GOLD REG (STAPLE) IMPLANT
RELOAD STAPLE 60 4.1 GRN THCK (STAPLE) ×1 IMPLANT
RELOAD STAPLE 60 BLK VRY/THCK (STAPLE) IMPLANT
RELOAD STAPLER 60MM BLK (STAPLE) IMPLANT
RELOAD STAPLER BLUE 60MM (STAPLE) ×5 IMPLANT
RELOAD STAPLER GOLD 60MM (STAPLE) ×1 IMPLANT
RELOAD STAPLER GREEN 60MM (STAPLE) ×1 IMPLANT
RELOAD STAPLER WHITE 60MM (STAPLE) ×1 IMPLANT
SCISSORS LAP 5X45 EPIX DISP (ENDOMECHANICALS) IMPLANT
SEALANT SURGICAL APPL DUAL CAN (MISCELLANEOUS) IMPLANT
SET TUBE SMOKE EVAC HIGH FLOW (TUBING) ×1 IMPLANT
SHEARS HARMONIC ACE PLUS 45CM (MISCELLANEOUS) ×1 IMPLANT
SLEEVE ADV FIXATION 5X100MM (TROCAR) ×2 IMPLANT
SLEEVE GASTRECTOMY 40FR VISIGI (MISCELLANEOUS) ×1 IMPLANT
SOL ANTI FOG 6CC (MISCELLANEOUS) ×1 IMPLANT
SOLUTION ANTI FOG 6CC (MISCELLANEOUS) ×1
SPIKE FLUID TRANSFER (MISCELLANEOUS) ×1 IMPLANT
STAPLE LINE REINFORCEMENT LAP (STAPLE) IMPLANT
STAPLER ECHELON BIOABSB 60 FLE (MISCELLANEOUS) IMPLANT
STAPLER ECHELON LONG 60 440 (INSTRUMENTS) ×1 IMPLANT
STAPLER RELOAD 60MM BLK (STAPLE)
STAPLER RELOAD BLUE 60MM (STAPLE) ×5
STAPLER RELOAD GOLD 60MM (STAPLE) ×1
STAPLER RELOAD GREEN 60MM (STAPLE) ×1
STAPLER RELOAD WHITE 60MM (STAPLE) ×1
STRIP CLOSURE SKIN 1/2X4 (GAUZE/BANDAGES/DRESSINGS) ×1 IMPLANT
SUT MNCRL AB 4-0 PS2 18 (SUTURE) ×1 IMPLANT
SUT SURGIDAC NAB ES-9 0 48 120 (SUTURE) IMPLANT
SUT VICRYL 0 TIES 12 18 (SUTURE) ×1 IMPLANT
SYR 20ML LL LF (SYRINGE) ×1 IMPLANT
SYR 50ML LL SCALE MARK (SYRINGE) ×1 IMPLANT
SYS KII OPTICAL ACCESS 15MM (TROCAR) ×1
SYSTEM KII OPTICAL ACCESS 15MM (TROCAR) ×1 IMPLANT
TOWEL OR 17X26 10 PK STRL BLUE (TOWEL DISPOSABLE) ×1 IMPLANT
TOWEL OR NON WOVEN STRL DISP B (DISPOSABLE) ×1 IMPLANT
TROCAR ADV FIXATION 12X100MM (TROCAR) IMPLANT
TROCAR ADV FIXATION 5X100MM (TROCAR) ×1 IMPLANT
TROCAR XCEL NON-BLD 5MMX100MML (ENDOMECHANICALS) IMPLANT
TROCAR Z-THREAD OPTICAL 5X100M (TROCAR) ×1 IMPLANT
TUBING CONNECTING 10 (TUBING) ×2 IMPLANT
TUBING ENDO SMARTCAP (MISCELLANEOUS) ×1 IMPLANT

## 2021-12-08 NOTE — Progress Notes (Signed)
PHARMACY CONSULT FOR:  Risk Assessment for Post-Discharge VTE Following Bariatric Surgery  Post-Discharge VTE Risk Assessment: This patient's probability of 30-day post-discharge VTE is increased due to the factors marked: x Sleeve gastrectomy   Liver disorder (transplant, cirrhosis, or nonalcoholic steatohepatitis)   Hx of VTE   Hemorrhage requiring transfusion   GI perforation, leak, or obstruction   ====================================================   x Male    Age >/=60 years  x  BMI >/=50 kg/m2    CHF    Dyspnea at Rest    Paraplegia    Non-gastric-band surgery    Operation Time >/=3 hr    Return to OR     Length of Stay >/= 3 d   Hypercoagulable condition   Significant venous stasis      Predicted probability of 30-day post-discharge VTE: 0.51%  Other patient-specific factors to consider:   Recommendation for Discharge: Enoxaparin 60 mg [place] q12h x 2 weeks post-discharge     Edgar Myers. is a 51 y.o. 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  No results for input(s): "WBC", "HGB", "HCT", "PLT", "APTT", "CREATININE", "LABCREA", "CREAT24HRUR", "MG", "PHOS", "ALBUMIN", "PROT", "AST", "ALT", "ALKPHOS", "BILITOT", "BILIDIR", "IBILI" in the last 72 hours. Estimated Creatinine Clearance: 138.7 mL/min (by C-G formula based on SCr of 1.12 mg/dL).    Past Medical History:  Diagnosis Date   Arthritis    Gout    Headache    Hypertension    Morbid obesity ([place])    PONV (postoperative nausea and vomiting)    Pre-diabetes    Sleep apnea    Umbilical hernia      Medications Prior to Admission  Medication Sig Dispense Refill Last Dose   acetaminophen (TYLENOL) 500 MG tablet Take 1,000-1,500 mg by mouth daily as needed for moderate pain.    Past Month   aspirin EC 81 MG tablet Take 1 tablet (81 mg total) by mouth daily. Swallow whole. 90 tablet 3 12/07/2021   furosemide (LASIX) 20 MG tablet Take 20 mg by mouth every other day.   Past Week   ibuprofen (ADVIL) 200 MG tablet Take 400-600 mg by mouth daily as needed for moderate pain.   Past Month   metFORMIN (GLUCOPHAGE) 500 MG tablet Take 500 mg by mouth 2 (two) times daily with a meal.   12/06/2021   nebivolol (BYSTOLIC) 10 MG tablet Take 10 mg by mouth daily.  0 12/08/2021 at 0800   olmesartan (BENICAR) 40 MG tablet Take 40 mg by mouth daily.   12/07/2021   simvastatin (ZOCOR) 10 MG tablet Take 10 mg by mouth daily.   12/07/2021   fluticasone-salmeterol (ADVAIR DISKUS) 500-50 MCG/ACT AEPB Inhale 1 puff into the lungs in the morning and at bedtime. (Patient not taking: Reported on 11/27/2021) 60 each 3 Not Taking   tirzepatide [facility]) 5 MG/0.5ML Pen Inject 5 mg into the skin every Sunday.   11/30/2021       Edgar Myers 12/08/2021,4:28 PM

## 2021-12-08 NOTE — Transfer of Care (Signed)
Immediate Anesthesia Transfer of Care Note  Patient: Edgar Myers.  Procedure(s) Performed: LAPAROSCOPIC SLEEVE GASTRECTOMY UPPER GI ENDOSCOPY  Patient Location: PACU  Anesthesia Type:General  Level of Consciousness: awake, alert  and oriented  Airway & Oxygen Therapy: Patient Spontanous Breathing and Patient connected to face mask oxygen  Post-op Assessment: Report given to RN and Post -op Vital signs reviewed and stable  Post vital signs: Reviewed and stable  Last Vitals:  Vitals Value Taken Time  BP 148/88 12/08/21 1630  Temp    Pulse 79 12/08/21 1629  Resp 17 12/08/21 1630  SpO2 93 % 12/08/21 1629  Vitals shown include unvalidated device data.  Last Pain:  Vitals:   12/08/21 1135  TempSrc:   PainSc: 0-No pain         Complications: No notable events documented.

## 2021-12-08 NOTE — Op Note (Signed)
Preoperative diagnosis: laparoscopic sleeve gastrectomy  Postoperative diagnosis: Same   Procedure: Upper endoscopy   Surgeon: Clovis Riley, M.D.  Anesthesia: Gen.   Description of procedure: The endoscope was placed in the mouth and oropharynx and under endoscopic vision it was advanced to the esophagogastric junction which was identified at 38cm from the teeth.  The pouch was tensely insufflated while the upper abdomen was flooded with irrigation to perform a leak test, which was negative. No bubbles were seen.  The staple line was hemostatic, there was one area along the mid staple line that had oozed slightly but appeared to have stopped. The lumen was evenly tubular without undue narrowing, angulation or twisting specifically at the incisura angularis. No appreciable retained fundus. The lumen was decompressed and the scope was withdrawn without difficulty.    Clovis Riley, M.D. General, Bariatric, & Minimally Invasive Surgery [facility]

## 2021-12-08 NOTE — Discharge Instructions (Signed)
GASTRIC BYPASS / SLEEVE  Home Care Instructions  These instructions are to help you care for yourself when you go home.  Call: If you have any problems. Call [phone] and ask for the surgeon on call If you have an emergency related to your surgery please use the ER at [place].  Tell the ER staff that you are a new post-op gastric bypass or gastric sleeve patient   Signs and symptoms to report: Severe vomiting or nausea If you cannot handle clear liquids for longer than 1 day, call your surgeon  Abdominal pain which does not get better after taking your pain medication Fever greater than 100.4 F and chills Heart rate over 100 beats a minute Trouble breathing Chest pain  Redness, swelling, drainage, or foul odor at incision (surgical) sites  If your incisions open or pull apart Swelling or pain in calf (lower leg) Diarrhea (Loose bowel movements that happen often), frequent watery, uncontrolled bowel movements Constipation, (no bowel movements for 3 days) if this happens:  Take Milk of Magnesia, 2 tablespoons by mouth, 3 times a day for 2 days if needed Stop taking Milk of Magnesia once you have had a bowel movement Call your doctor if constipation continues Or Take Miralax  (instead of Milk of Magnesia) following the label instructions Stop taking Miralax once you have had a bowel movement Call your doctor if constipation continues Anything you think is "abnormal for you"   Normal side effects after surgery: Unable to sleep at night or unable to concentrate Irritability Being tearful (crying) or depressed These are common complaints, possibly related to your anesthesia, stress of surgery and change in lifestyle, that usually go away a few weeks after surgery.  If these feelings continue, call your medical doctor.  Wound Care: You may have surgical glue, steri-strips, or staples over your incisions after surgery Surgical glue:  Looks like a clear film over your incisions  and will wear off a little at a time Steri-strips : Adhesive strips of tape over your incisions. You may notice a yellowish color on the skin under the steri-strips. This is used to make the   steri-strips stick better. Do not pull the steri-strips off - let them fall off Staples: Staples may be removed before you leave the hospital If you go home with staples, call [facility] at for an appointment with your surgeon's nurse to have staples removed 10 days after surgery, [phone] Showering: You may shower two (2) days after your surgery unless your surgeon tells you differently Wash gently around incisions with warm soapy water, rinse well, and gently pat dry  If you have a drain (tube from your incision), you may need someone to hold this while you shower  No tub baths until staples are removed and incisions are healed     Medications: Medications should be liquid or crushed if larger than the size of a dime Extended release pills (medication that releases a little bit at a time through the day) should not be crushed Depending on the size and number of medications you take, you may need to space (take a few throughout the day)/change the time you take your medications so that you do not over-fill your pouch (smaller stomach) Make sure you follow-up with your primary care physician to make medication changes needed during rapid weight loss and life-style changes If you have diabetes, follow up with the doctor that orders your diabetes medication(s) within one week after surgery and check   your blood sugar regularly. Do not drive while taking narcotics (pain medications) DO NOT take NSAID'S (Examples of NSAID's include ibuprofen, naproxen)  Diet:                    First 2 Weeks  You will see the nutritionist about two (2) weeks after your surgery. The nutritionist will increase the types of foods you can eat if you are handling liquids well: If you have severe vomiting or nausea  and cannot handle clear liquids lasting longer than 1 day, call your surgeon  Protein Shake Drink at least 2 ounces of shake 5-6 times per day Each serving of protein shakes (usually 8 - 12 ounces) should have a minimum of:  15 grams of protein  And no more than 5 grams of carbohydrate  Goal for protein each day: Men = 80 grams per day Women = 60 grams per day Protein powder may be added to fluids such as non-fat milk or Lactaid milk or Soy milk (limit to 35 grams added protein powder per serving)  Hydration Slowly increase the amount of water and other clear liquids as tolerated (See Acceptable Fluids) Slowly increase the amount of protein shake as tolerated   Sip fluids slowly and throughout the day May use sugar substitutes in small amounts (no more than 6 - 8 packets per day; i.e. Splenda)  Fluid Goal The first goal is to drink at least 8 ounces of protein shake/drink per day (or as directed by the nutritionist);  See handout from pre-op Bariatric Education Class for examples of protein shake/drink.   Slowly increase the amount of protein shake you drink as tolerated You may find it easier to slowly sip shakes throughout the day It is important to get your proteins in first Your fluid goal is to drink 64 - 100 ounces of fluid daily It may take a few weeks to build up to this 32 oz (or more) should be clear liquids  And  32 oz (or more) should be full liquids (see below for examples) Liquids should not contain sugar, caffeine, or carbonation  Clear Liquids: Water or Sugar-free flavored water (i.e. Fruit H2O, Propel) Decaffeinated coffee or tea (sugar-free) Jamesyn Lindell Lite, Wyler's Lite, Minute Maid Lite Sugar-free Jell-O Bouillon or broth Sugar-free Popsicle:   *Less than 20 calories each; Limit 1 per day  Full Liquids: Protein Shakes/Drinks + 2 choices per day of other full liquids Full liquids must be: No More Than 12 grams of Carbs per serving  No More Than 3 grams of Fat  per serving Strained low-fat cream soup Non-Fat milk Fat-free Lactaid Milk Sugar-free yogurt (Dannon Lite & Fit, Greek yogurt)      Vitamins and Minerals Start 1 day after surgery unless otherwise directed by your surgeon Bariatric Specific Complete Multivitamins Chewable Calcium Citrate with Vitamin D-3 (Example: 3 Chewable Calcium Plus 600 with Vitamin D-3) Take 500 mg three (3) times a day for a total of 1500 mg each day Do not take all 3 doses of calcium at one time as it may cause constipation, and you can only absorb 500 mg  at a time  Do not mix multivitamins containing iron with calcium supplements; take 2 hours apart  Menstruating women and those at risk for anemia (a blood disease that causes weakness) may need extra iron Talk with your doctor to see if you need more iron If you need extra iron: Total daily Iron recommendation (including Vitamins) is 50 to 100   mg Iron/day Do not stop taking or change any vitamins or minerals until you talk to your nutritionist or surgeon Your nutritionist and/or surgeon must approve all vitamin and mineral supplements   Activity and Exercise: It is important to continue walking at home.  Limit your physical activity as instructed by your doctor.  During this time, use these guidelines: Do not lift anything greater than ten (10) pounds for at least two (2) weeks Do not go back to work or drive until your surgeon says you can You may have sex when you feel comfortable  It is VERY important for male patients to use a reliable birth control method; fertility often increases after surgery  Do not get pregnant for at least 18 months Start exercising as soon as your doctor tells you that you can Make sure your doctor approves any physical activity Start with a simple walking program Walk 5-15 minutes each day, 7 days per week.  Slowly increase until you are walking 30-45 minutes per day Consider joining our BELT program. [phone] or email  [email or web address]   Special Instructions Things to remember:  Use your CPAP when sleeping if this applies to you, do not stop the use of CPAP unless directed by physician after a sleep study [facility] has a free Bariatric Surgery Support Group that meets monthly, the 3rd Thursday, 6 pm.  Please review discharge information for date and location of this meeting. It is very important to keep all follow up appointments with your surgeon, nutritionist, primary care physician, and behavioral health practitioner After the first year, please follow up with your bariatric surgeon and nutritionist at least once a year in order to maintain best weight loss results   [facility]: [phone] [facility]: [phone] Bariatric Nurse Coordinator: [phone]     Enoxaparin (Lovenox) Self- Injection Instructions Wash your hands with soap and water Find a spot on the left or right side of the abdomen  2 inches from umbilicus "belly button"- DO NOT INJECT INTO INCISIONS Make sure to rotate sites (Do not inject in the same spot twice in a row) Clean the spot with alcohol swab, LET DRY Pull cap straight off, do not twist (do not let needle touch anything)Ok to leave in air bubble Place syringe in dominant (writing) hand, take other hand to "pinch an inch" (same area that you cleaned with the alcohol swab) Insert the entire needle into the fold of skin at a 90-degree angle Press the plunger all the way down with your thumb while the other hand keeps "pinching" the skin (Make sure to get all the medicine) Pull the needle straight out, then let go of the skin To activate the safety shield, hold the plunger away from yourself (and anyone else if necessary) Press with your thumb until you hear a "click" and the safety shield activates Place the used syringe and cap into the sharps disposal container.  

## 2021-12-08 NOTE — Progress Notes (Signed)
Patient seen in Short Stay prior to surgery.  Discussed QI "Goals for Discharge" document with patient including ambulation in halls, Incentive Spirometry use every hour, and oral care.  Also discussed pain and nausea control.  BSTOP education provided including BSTOP information guide, "Guide for Pain Management after your Bariatric Procedure".  Diet progression education provided including "Bariatric Surgery Post-Op Food Plan Phase 1: Liquids".  Questions answered.  Will continue to partner with bedside RN and follow up with patient per protocol after surgery complete.

## 2021-12-08 NOTE — Anesthesia Preprocedure Evaluation (Addendum)
Anesthesia Evaluation  Patient identified by MRN, date of birth, ID band Patient awake    Reviewed: Allergy & Precautions, H&P , NPO status , Patient's Chart, lab work & pertinent test results, reviewed documented beta blocker date and time   History of Anesthesia Complications (+) PONV and history of anesthetic complications  Airway Mallampati: II  TM Distance: >3 FB Neck ROM: Full    Dental no notable dental hx. (+) Teeth Intact, Dental Advisory Given   Pulmonary sleep apnea ,    Pulmonary exam normal breath sounds clear to auscultation       Cardiovascular hypertension, Pt. on medications and Pt. on home beta blockers  Rhythm:Regular Rate:Normal     Neuro/Psych  Headaches, negative psych ROS   GI/Hepatic negative GI ROS, Neg liver ROS,   Endo/Other  diabetes, Type 2, Oral Hypoglycemic AgentsMorbid obesity  Renal/GU negative Renal ROS  negative genitourinary   Musculoskeletal  (+) Arthritis ,   Abdominal   Peds  Hematology negative hematology ROS (+)   Anesthesia Other Findings   Reproductive/Obstetrics negative OB ROS                            Anesthesia Physical Anesthesia Plan  ASA: 4  Anesthesia Plan: General   Post-op Pain Management: Tylenol PO (pre-op)* and Toradol IV (intra-op)*   Induction: Intravenous  PONV Risk Score and Plan: 4 or greater and Ondansetron, Dexamethasone and Midazolam  Airway Management Planned: Oral ETT and Video Laryngoscope Planned  Additional Equipment:   Intra-op Plan:   Post-operative Plan: Extubation in OR  Informed Consent: I have reviewed the patients History and Physical, chart, labs and discussed the procedure including the risks, benefits and alternatives for the proposed anesthesia with the patient or authorized representative who has indicated his/her understanding and acceptance.     Dental advisory given  Plan Discussed with:  CRNA  Anesthesia Plan Comments:        Anesthesia Quick Evaluation

## 2021-12-08 NOTE — Anesthesia Procedure Notes (Signed)
Procedure Name: Intubation Date/Time: 12/08/2021 1:41 PM  Performed by: Gean Maidens, CRNAPre-anesthesia Checklist: Patient identified, Emergency Drugs available, Suction available, Patient being monitored and Timeout performed Patient Re-evaluated:Patient Re-evaluated prior to induction Oxygen Delivery Method: Circle system utilized Preoxygenation: Pre-oxygenation with 100% oxygen Induction Type: IV induction Ventilation: Mask ventilation with difficulty, Oral airway inserted - appropriate to patient size and Two handed mask ventilation required Laryngoscope Size: Glidescope and 4 Grade View: Grade I Tube type: Oral Tube size: 8.0 mm Number of attempts: 1 Airway Equipment and Method: Stylet and Video-laryngoscopy Placement Confirmation: ETT inserted through vocal cords under direct vision, positive ETCO2 and breath sounds checked- equal and bilateral Secured at: 23 cm Tube secured with: Tape Dental Injury: Teeth and Oropharynx as per pre-operative assessment

## 2021-12-08 NOTE — Anesthesia Postprocedure Evaluation (Signed)
Anesthesia Post Note  Patient: Tex [place]roy.  Procedure(s) Performed: LAPAROSCOPIC SLEEVE GASTRECTOMY UPPER GI ENDOSCOPY     Patient location during evaluation: PACU Anesthesia Type: General Level of consciousness: awake and alert Pain management: pain level controlled Vital Signs Assessment: post-procedure vital signs reviewed and stable Respiratory status: spontaneous breathing, nonlabored ventilation, respiratory function stable and patient connected to nasal cannula oxygen Cardiovascular status: blood pressure returned to baseline and stable Postop Assessment: no apparent nausea or vomiting Anesthetic complications: no   No notable events documented.  Last Vitals:  Vitals:   12/08/21 1715 12/08/21 1730  BP: (!) 164/86 (!) 169/87  Pulse: 73 73  Resp: 17 16  Temp:    SpO2: 100% 100%    Last Pain:  Vitals:   12/08/21 1730  TempSrc:   PainSc: Asleep                 Fynn Adel,W. EDMOND

## 2021-12-08 NOTE — Brief Op Note (Signed)
12/08/2021  3:56 PM  PATIENT:  Edgar Myers.  51 y.o. male  PRE-OPERATIVE DIAGNOSIS:  MORBID OBESITY  POST-OPERATIVE DIAGNOSIS:  MORBID OBESITY  PROCEDURE:  Procedure(s): LAPAROSCOPIC SLEEVE GASTRECTOMY (N/A) UPPER GI ENDOSCOPY (N/A)  SURGEON:  Surgeon(s) and Role:    * Greer Pickerel, MD - Primary    * Clovis Riley, MD - Assisting  PHYSICIAN ASSISTANT:   ASSISTANTS: see above   ANESTHESIA:   general  EBL:  25 mL   BLOOD ADMINISTERED:none  DRAINS: none   LOCAL MEDICATIONS USED:  OTHER exparel  SPECIMEN:  Source of Specimen:  greater curvature of stomach  DISPOSITION OF SPECIMEN:  PATHOLOGY  COUNTS:  YES  TOURNIQUET:  * No tourniquets in log *  DICTATION: .Dragon Dictation  PLAN OF CARE: Admit to inpatient   PATIENT DISPOSITION:  PACU - hemodynamically stable.   Delay start of Pharmacological VTE agent (>24hrs) due to surgical blood loss or risk of bleeding: no   [name]. [name], MD, FACS General, Bariatric, & Minimally Invasive Surgery [facility],  [place]

## 2021-12-08 NOTE — Op Note (Signed)
12/08/2021 [place]. 05-17-1970 [id number]   PRE-OPERATIVE DIAGNOSIS:   Severe obesity (BMI 66)  Diabetes mellitus type 2 in obese (CMS-HCC)  Hypertension, essential  OSA (obstructive sleep apnea)  Chronic midline low back pain with bilateral sciatica  Umbilical hernia without obstruction and without gangrene  Gout of multiple sites, unspecified cause, unspecified chronicity   POST-OPERATIVE DIAGNOSIS:  same  PROCEDURE:  Procedure(s): LAPAROSCOPIC SLEEVE GASTRECTOMY (22 modifier- the patient's severe central visceral obesity significantly increased the technical difficulty of the case) UPPER GI ENDOSCOPY LAPAROSCOPIC BILATERAL TAP BLOCK  SURGEON:  Surgeon(s): Gayland Curry, MD FACS FASMBS  ASSISTANTS: Romana Juniper MD FACS; Drema Balzarine Simspon RNFA  ANESTHESIA:   general  DRAINS: none   BOUGIE: 40 fr ViSiGi  LOCAL MEDICATIONS USED:   Exparel  EBL: 25cc  SPECIMEN:  Source of Specimen:  Greater curvature of stomach  DISPOSITION OF SPECIMEN:  PATHOLOGY  COUNTS:  YES  INDICATION FOR PROCEDURE: This is a very pleasant 51 y.o.-year-old morbidly obese male who has had unsuccessful attempts for sustained weight loss. The patient presents today for a planned laparoscopic sleeve gastrectomy with upper endoscopy. We have discussed the risk and benefits of the procedure extensively preoperatively. Please see my separate notes.  PROCEDURE: After obtaining informed consent and receiving 5000 units of subcutaneous heparin, the patient was brought to the operating room at [facility] and placed supine on the operating room table. General endotracheal anesthesia was established. Sequential compression devices were placed. A orogastric tube was placed. The patient's abdomen was prepped and draped in the usual standard surgical fashion. The patient received preoperative IV antibiotic. A surgical timeout was performed. ERAS protocol used.   Access to the abdomen was  achieved using a 5 mm 0 laparoscope thru a 5 mm trocar In the left upper Quadrant 2 fingerbreadths below the left subcostal margin using the Optiview technique. Pneumoperitoneum was smoothly established up to 15 mm of mercury. The laparoscope was advanced and the abdominal cavity was surveilled. The patient had a known fat containing umbilical hernia which was left alone.  The patient was then placed in reverse Trendelenburg.   A 5 mm trocar was placed slightly above and to the left of the umbilicus under direct visualization.  In order to visualize the right side of the abdominal wall we had mobilized the falciform ligament taken down given the significant amount of central visceral adipose tissue.  The [facility] liver retractor was placed under the left lobe of the liver through a 5 mm trocar incision site in the subxiphoid position.  We were able to visualize the GE junction and diaphragm so I decided to proceed with the case.  A 5 mm trocar was placed in the lateral right upper quadrant along with a 15 mm trocar in the mid right abdomen. A final 5 mm trocar was placed in the lateral LUQ.  All under direct visualization after exparel had been infiltrated in bilateral lateral upper abdominal walls as a TAP block for postoperative pain relief.  Given the torque in the abdominal wall we exchanged the camera trocar for an 11 mm trocar and changed to a 10 mm laparoscope  The stomach was inspected. It was completely decompressed and the orogastric tube was removed.  There was no dimple that was obviously visible.  His preoperative upper GI showed no hiatal hernia.     We identified the pylorus and measured 6 cm proximal to the pylorus and identified an area of where we would start  taking down the short gastric vessels. Harmonic scalpel was used to take down the short gastric vessels along the greater curvature of the stomach. We were able to enter the lesser sac. We continued to march along the greater  curvature of the stomach taking down the short gastrics.    As we approached the gastrosplenic ligament it was difficult to see in this area.  I took down some of the esophageal fat pad off of the left crus of the diaphragm.  Due to the inability to see in this area I decided to go ahead and perform the sleeve gastrectomy and then take down the rest of the short gastrics.   There were not any significant posterior gastric avascular attachments. This left the stomach completely mobilized. No vessels had been taken down along the lesser curvature of the stomach.  We then reidentified the pylorus. A 40Fr ViSiGi was then placed in the oropharynx and advanced down into the stomach and placed in the distal antrum and positioned along the lesser curvature. It was placed under suction which secured the 40Fr ViSiGi in place along the lesser curve. Then using the Ethicon echelon 60 mm stapler with a green load with ethicon staple line reinforcement (ESLR), I placed a stapler along the antrum approximately 5 cm from the pylorus. The stapler was angled so that there is ample room at the angularis incisura. I then fired the first staple load after inspecting it posteriorly to ensure adequate space both anteriorly and posteriorly. At this point I still was not completely past the angularis so with a gold load with ESLR, I placed the stapler in position just inside the prior stapleline. We then rotated the stomach to insure that there was adequate anteriorly as well as posteriorly. The stapler was then fired.  At this point I started using 60 mm blue load staple cartridges with ESLR. The echelon stapler was then repositioned with a 60 mm blue load with ESLR and we continued to march up along the [place]. My assistant was holding traction along the greater curvature stomach along the cauterized short gastric vessels ensuring that the stomach was symmetrically retracted. Prior to each firing of the staple, we rotated the stomach  to ensure that there is adequate stomach left.  As we approached the fundus, I used 60 mm blue cartridge with ESLR aiming  lateral to the GE junction after mobilizing some of the esophageal fat pad.  I was able to see the left crus of the diaphragm.  This freed our resected portion of the greater curvature of the stomach except for it was still attached along the short gastrics at the gastrosplenic ligament.  We were able to free it from this area and take down the remaining short gastrics with harmonic scalpel.  This completely freed the greater curvature of the stomach.  The sleeve was inspected. There is no evidence of cork screw. The staple line appeared hemostatic. The CRNA inflated the ViSiGi to the green zone and the upper abdomen was flooded with saline. There were no bubbles. The sleeve was decompressed and the ViSiGi removed. My assistant scrubbed out and performed an upper endoscopy. The sleeve easily distended with air and the scope was easily advanced to the pylorus. There is no evidence of internal bleeding or cork screwing. There was no narrowing at the angularis. There is no evidence of bubbles. Please see her operative note for further details. The gastric sleeve was decompressed and the endoscope was removed.  There was  a little bit of oozing along the sleeve staple line along the second staple line and the distal sleeve.  Five 5 mm clips were applied for excellent hemostasis.  The greater curvature the stomach was grasped with a laparoscopic grasper and removed from the 15 mm trocar site in applied specimen bag.  The liver retractor was removed. I then closed the 15 mm trocar site with 1 interrupted 0 Vicryl sutures through the fascia using the endoclose. The closure was viewed laparoscopically and it was airtight. Remaining Exparel was then infiltrated in the preperitoneal spaces around the trocar sites. Pneumoperitoneum was released. All trocar sites were closed with a 4-0 Monocryl by the RNFA  in a subcuticular fashion followed by the application of steri-strips, and bandaids. The patient was extubated and taken to the recovery room in stable condition. All needle, instrument, and sponge counts were correct x2. There are no immediate complications  (1) 60 mm green with ESLR (1) 60 mm gold with ESLR (5) 60 mm blue with ESLR (1) 38m white  PLAN OF CARE: Admit to inpatient   PATIENT DISPOSITION:  PACU - hemodynamically stable.   Delay start of Pharmacological VTE agent (>24hrs) due to surgical blood loss or risk of bleeding:  no  ELeighton Ruff WRedmond Pulling MD, FACS FASMBS General, Bariatric, & Minimally Invasive Surgery [facility] [place]

## 2021-12-08 NOTE — Interval H&P Note (Signed)
History and Physical Interval Note:  12/08/2021 12:37 PM  Edgar Myers.  has presented today for surgery, with the diagnosis of MORBID OBESITY.  The various methods of treatment have been discussed with the patient and family. After consideration of risks, benefits and other options for treatment, the patient has consented to  Procedure(s): LAPAROSCOPIC SLEEVE GASTRECTOMY (N/A) UPPER GI ENDOSCOPY (N/A) as a surgical intervention.  The patient's history has been reviewed, patient examined, no change in status, stable for surgery.  I have reviewed the patient's chart and labs.  Questions were answered to the patient's satisfaction.   [name]. [name], MD, [place], Bariatric, & Minimally Invasive Surgery [name],  [place]   Greer Pickerel

## 2021-12-09 ENCOUNTER — Telehealth (HOSPITAL_COMMUNITY): Payer: Self-pay | Admitting: Pharmacy Technician

## 2021-12-09 ENCOUNTER — Encounter (HOSPITAL_COMMUNITY): Payer: Self-pay | Admitting: General Surgery

## 2021-12-09 ENCOUNTER — Other Ambulatory Visit (HOSPITAL_COMMUNITY): Payer: Self-pay

## 2021-12-09 LAB — COMPREHENSIVE METABOLIC PANEL
ALT: 85 U/L — ABNORMAL HIGH (ref 0–44)
AST: 56 U/L — ABNORMAL HIGH (ref 15–41)
Albumin: 4.1 g/dL (ref 3.5–5.0)
Alkaline Phosphatase: 34 U/L — ABNORMAL LOW (ref 38–126)
Anion gap: 9 (ref 5–15)
BUN: 10 mg/dL (ref 6–20)
CO2: 25 mmol/L (ref 22–32)
Calcium: 9.6 mg/dL (ref 8.9–10.3)
Chloride: 104 mmol/L (ref 98–111)
Creatinine, Ser: 1.26 mg/dL — ABNORMAL HIGH (ref 0.61–1.24)
GFR, Estimated: >60 mL/min (ref >60)
Glucose, Bld: 152 mg/dL — ABNORMAL HIGH (ref 70–99)
Potassium: 5.3 mmol/L — ABNORMAL HIGH (ref 3.5–5.1)
Sodium: 138 mmol/L (ref 135–145)
Total Bilirubin: 0.7 mg/dL (ref 0.3–1.2)
Total Protein: 7.4 g/dL (ref 6.5–8.1)

## 2021-12-09 LAB — CBC WITH DIFFERENTIAL/PLATELET
Abs Immature Granulocytes: 0.05 10*3/uL (ref 0.00–0.07)
Basophils Absolute: 0 10*3/uL (ref 0.0–0.1)
Basophils Relative: 0 %
Eosinophils Absolute: 0 10*3/uL (ref 0.0–0.5)
Eosinophils Relative: 0 %
HCT: 45.1 % (ref 39.0–52.0)
Hemoglobin: 14.8 g/dL (ref 13.0–17.0)
Immature Granulocytes: 1 %
Lymphocytes Relative: 6 %
Lymphs Abs: 0.6 10*3/uL — ABNORMAL LOW (ref 0.7–4.0)
MCH: 29.9 pg (ref 26.0–34.0)
MCHC: 32.8 g/dL (ref 30.0–36.0)
MCV: 91.1 fL (ref 80.0–100.0)
Monocytes Absolute: 0.4 10*3/uL (ref 0.1–1.0)
Monocytes Relative: 4 %
Neutro Abs: 9 10*3/uL — ABNORMAL HIGH (ref 1.7–7.7)
Neutrophils Relative %: 89 %
Platelets: 242 10*3/uL (ref 150–400)
RBC: 4.95 MIL/uL (ref 4.22–5.81)
RDW: 13.1 % (ref 11.5–15.5)
WBC: 10.1 10*3/uL (ref 4.0–10.5)
nRBC: 0 % (ref 0.0–0.2)

## 2021-12-09 LAB — GLUCOSE, CAPILLARY
Glucose-Capillary: 103 mg/dL — ABNORMAL HIGH (ref 70–99)
Glucose-Capillary: 104 mg/dL — ABNORMAL HIGH (ref 70–99)
Glucose-Capillary: 144 mg/dL — ABNORMAL HIGH (ref 70–99)
Glucose-Capillary: 156 mg/dL — ABNORMAL HIGH (ref 70–99)
Glucose-Capillary: 162 mg/dL — ABNORMAL HIGH (ref 70–99)
Glucose-Capillary: 96 mg/dL (ref 70–99)
Glucose-Capillary: 97 mg/dL (ref 70–99)

## 2021-12-09 MED ORDER — ENOXAPARIN (LOVENOX) PATIENT EDUCATION KIT
PACK | Freq: Once | Status: AC
Start: 2021-12-09 — End: 2021-12-09
  Filled 2021-12-09: qty 1

## 2021-12-09 MED ORDER — SODIUM CHLORIDE 0.9 % IV SOLN: INTRAVENOUS | Status: DC

## 2021-12-09 NOTE — Progress Notes (Signed)
Patient is refusing for H+ H to be drawn, MD notified.

## 2021-12-09 NOTE — Progress Notes (Signed)
  Transition of Care [facility]) Screening Note   Patient Details  Name: Edgar Myers. Date of Birth: [birth year]   Transition of Care [facility]) CM/SW Contact:    Lennart Pall, LCSW Phone Number: 12/09/2021, 12:30 PM    Transition of Care Department [facility]) has reviewed patient and no TOC needs have been identified at this time. We will continue to monitor patient advancement through interdisciplinary progression rounds. If new patient transition needs arise, please place a TOC consult.

## 2021-12-09 NOTE — TOC Benefit Eligibility Note (Signed)
Patient Teacher, English as a foreign language completed.    The patient is currently admitted and upon discharge could be taking enoxaparin (Lovenox) 60 mg/0.6 ml.  The current 14 day co-pay is $36.63.   The patient is insured through [place], [place] Patient [place] Patient Advocate Team Direct Number: [phone]  Fax: [phone]

## 2021-12-09 NOTE — Telephone Encounter (Signed)
Pharmacy Patient Advocate Encounter  Insurance verification completed.    The patient is insured through [employer]   The patient is currently admitted and ran test claims for the following: enoxaparin .  Copays and coinsurance results were relayed to Inpatient clinical team.

## 2021-12-09 NOTE — Progress Notes (Signed)
Patient alert and oriented, pain is controlled. Patient is tolerating fluids, advancing to protein shake today, patient is tolerating well.  Reviewed Gastric sleeve discharge instructions with patient and patient is able to articulate understanding.  Provided information on BELT program, Support Group and [facility] outpatient pharmacy.  Reviewed Lovenox teaching kit and pt reviewed Lovenox "Patient-Self Injection Video".    All questions answered, will continue to monitor.

## 2021-12-10 ENCOUNTER — Other Ambulatory Visit (HOSPITAL_COMMUNITY): Payer: Self-pay

## 2021-12-10 LAB — CBC WITH DIFFERENTIAL/PLATELET
Abs Immature Granulocytes: 0.04 10*3/uL (ref 0.00–0.07)
Basophils Absolute: 0 10*3/uL (ref 0.0–0.1)
Basophils Relative: 0 %
Eosinophils Absolute: 0 10*3/uL (ref 0.0–0.5)
Eosinophils Relative: 0 %
HCT: 52.2 % — ABNORMAL HIGH (ref 39.0–52.0)
Hemoglobin: 17.2 g/dL — ABNORMAL HIGH (ref 13.0–17.0)
Immature Granulocytes: 0 %
Lymphocytes Relative: 15 %
Lymphs Abs: 1.6 10*3/uL (ref 0.7–4.0)
MCH: 30 pg (ref 26.0–34.0)
MCHC: 33 g/dL (ref 30.0–36.0)
MCV: 90.9 fL (ref 80.0–100.0)
Monocytes Absolute: 1.1 10*3/uL — ABNORMAL HIGH (ref 0.1–1.0)
Monocytes Relative: 11 %
Neutro Abs: 7.8 10*3/uL — ABNORMAL HIGH (ref 1.7–7.7)
Neutrophils Relative %: 74 %
Platelets: 157 10*3/uL (ref 150–400)
RBC: 5.74 MIL/uL (ref 4.22–5.81)
RDW: 13.2 % (ref 11.5–15.5)
WBC: 10.6 10*3/uL — ABNORMAL HIGH (ref 4.0–10.5)
nRBC: 0 % (ref 0.0–0.2)

## 2021-12-10 LAB — SURGICAL PATHOLOGY

## 2021-12-10 LAB — GLUCOSE, CAPILLARY
Glucose-Capillary: 101 mg/dL — ABNORMAL HIGH (ref 70–99)
Glucose-Capillary: 121 mg/dL — ABNORMAL HIGH (ref 70–99)

## 2021-12-10 MED ORDER — ENOXAPARIN SODIUM 60 MG/0.6ML IJ SOSY
60.0000 mg | PREFILLED_SYRINGE | Freq: Two times a day (BID) | INTRAMUSCULAR | 0 refills | Status: AC
  Filled 2021-12-10: qty 9.6, 8d supply, fill #0
  Filled 2021-12-10: qty 7.2, 6d supply, fill #0

## 2021-12-10 MED ORDER — ONDANSETRON 4 MG PO TBDP
4.0000 mg | ORAL_TABLET | Freq: Four times a day (QID) | ORAL | 0 refills | Status: AC | PRN
  Filled 2021-12-10: qty 20, 5d supply, fill #0

## 2021-12-10 MED ORDER — PANTOPRAZOLE SODIUM 40 MG PO TBEC
40.0000 mg | DELAYED_RELEASE_TABLET | Freq: Every day | ORAL | 0 refills | Status: AC
  Filled 2021-12-10: qty 90, 90d supply, fill #0

## 2021-12-10 MED ORDER — OXYCODONE HCL 5 MG PO TABS
5.0000 mg | ORAL_TABLET | Freq: Four times a day (QID) | ORAL | 0 refills | Status: AC | PRN
  Filled 2021-12-10: qty 10, 3d supply, fill #0

## 2021-12-10 MED ORDER — ACETAMINOPHEN 500 MG PO TABS: 1000.0000 mg | ORAL_TABLET | Freq: Three times a day (TID) | ORAL | 0 refills | Status: AC

## 2021-12-10 NOTE — Progress Notes (Signed)
24hr fluid recall prior to discharge: 460m.  Per dehydration protocol, will call pt to f/u within one week post op.

## 2021-12-10 NOTE — Progress Notes (Signed)
Pt has not met protein goals/oral intake goals today and will be at risk for dehydration & readmission Not ready for discharge Needs another night  [name]. [name], MD, FACS General, Bariatric, & Minimally Invasive Surgery [facility] Surgery,  [place]

## 2021-12-10 NOTE — Discharge Summary (Signed)
Physician Discharge Summary  Edgar Myers. TIW:[id number] DOB: 04/04/1971 DOA: 12/08/2021  PCP: Neale Burly, MD  Admit date: 12/08/2021 Discharge date: 12/10/2021  Recommendations for Outpatient Follow-up:    Follow-up Information     Greer Pickerel, MD. Go on 02/05/2022.   Specialty: General Surgery Why: at 9:30am.  Please arrive 15 minutes prior to your appointment time.  Thank you. Contact information: [address] [place] [address] [phone]         Maczis, Carlena Hurl, [place]. Go on 12/30/2021.   Specialty: General Surgery Why: at 2:30pm for Dr. [name].  Please arrive 15 minutes prior to your appointment time.  Thank you. Contact information: [address] [place] [address] [phone]                Discharge Diagnoses:  Principal Problem:   S/P laparoscopic sleeve gastrectomy Severe obesity (BMI 66)  Diabetes mellitus type 2 in obese (CMS-HCC)  Hypertension, essential  OSA (obstructive sleep apnea)  Chronic midline low back pain with bilateral sciatica  Umbilical hernia without obstruction and without gangrene  Gout of multiple sites, unspecified cause, unspecified chronicity  Surgical Procedure: Laparoscopic Sleeve Gastrectomy, upper endoscopy  Discharge Condition: Good Disposition: Home  Diet recommendation: Postoperative sleeve gastrectomy diet (liquids only)  Filed Weights   12/08/21 1142  Weight: (!) 208.3 kg     Hospital Course:  The patient was admitted for a planned laparoscopic sleeve gastrectomy. Please see operative note. His surgery was very technically demanding given his body habitus.  Preoperatively the patient was given 5000 units of subcutaneous heparin for DVT prophylaxis. Postoperative prophylactic Lovenox dosing was started on the evening of postoperative day 0. ERAS protocol was used. On the evening of postoperative day 0, the patient was started on water and ice chips. On postoperative  day 1 the patient had no fever or tachycardia and was tolerating water in their diet was gradually advanced throughout the day. However, he did not meet oral intake goals for discharge on pod1 so he was kept another night bc of risk of dehydration. The patient was ambulating without difficulty. Their vital signs are stable without fever or tachycardia. Their hemoglobin had remained stable. The patient was maintained on their home settings for CPAP therapy. The patient had received discharge instructions and counseling. They were deemed stable for discharge and had met discharge criteria  He met criteria for extended vte prophylaxis and recd lovenox instruction  BP (!) 175/86 (BP Location: Left Arm)   Pulse (!) 53   Temp 97.9 F (36.6 C) (Oral)   Resp 18   Ht 5' 10"  (1.778 m)   Wt (!) 208.3 kg   SpO2 99%   BMI 65.88 kg/m   Gen: alert, NAD, non-toxic appearing, sitting in bedside chair Pupils: equal, no scleral icterus Pulm: Lungs clear to auscultation, symmetric chest rise CV: regular rate and rhythm Abd: soft, mild approp tender, nondistended. No cellulitis. No incisional hernia Ext: no edema, no calf tenderness Skin: no rash, no jaundice  Discussed dc instructions    Discharge Instructions  Discharge Instructions     Ambulate hourly while awake   Complete by: As directed    Call MD for:  difficulty breathing, headache or visual disturbances   Complete by: As directed    Call MD for:  persistant dizziness or light-headedness   Complete by: As directed    Call MD for:  persistant nausea and vomiting   Complete by:  As directed    Call MD for:  redness, tenderness, or signs of infection (pain, swelling, redness, odor or green/yellow discharge around incision site)   Complete by: As directed    Call MD for:  severe uncontrolled pain   Complete by: As directed    Call MD for:  temperature >101 F   Complete by: As directed    Diet bariatric full liquid   Complete by: As  directed    Discharge instructions   Complete by: As directed    See bariatric discharge instructions   Incentive spirometry   Complete by: As directed    Perform hourly while awake      Allergies as of 12/10/2021   No Known Allergies      Medication List     STOP taking these medications    aspirin EC 81 MG tablet   fluticasone-salmeterol 500-50 MCG/ACT Aepb Commonly known as: Advair Diskus   ibuprofen 200 MG tablet Commonly known as: ADVIL   metFORMIN 500 MG tablet Commonly known as: GLUCOPHAGE   tirzepatide 5 MG/0.5ML Pen Commonly known as: MOUNJARO       TAKE these medications    acetaminophen 500 MG tablet Commonly known as: TYLENOL Take 2 tablets (1,000 mg total) by mouth every 8 (eight) hours for 5 days. What changed:  how much to take when to take this reasons to take this   enoxaparin 60 MG/0.6ML injection Commonly known as: LOVENOX Inject 0.6 mLs (60 mg total) into the skin every 12 (twelve) hours for 14 days.   furosemide 20 MG tablet Commonly known as: LASIX Take 20 mg by mouth every other day. Notes to patient: Monitor Blood Pressure Daily and keep a log for primary care physician.  Monitor for symptoms of dehydration.  You may need to make changes to your medications with rapid weight loss.     nebivolol 10 MG tablet Commonly known as: BYSTOLIC Take 10 mg by mouth daily. Notes to patient: Monitor Blood Pressure Daily and keep a log for primary care physician.  You may need to make changes to your medications with rapid weight loss.     olmesartan 40 MG tablet Commonly known as: BENICAR Take 40 mg by mouth daily. Notes to patient: Monitor Blood Pressure Daily and keep a log for primary care physician.  You may need to make changes to your medications with rapid weight loss.     ondansetron 4 MG disintegrating tablet Commonly known as: ZOFRAN-ODT Dissolve 1 tablet (4 mg total) by mouth every 6 (six) hours as needed for nausea or  vomiting.   oxyCODONE 5 MG immediate release tablet Commonly known as: Oxy IR/ROXICODONE Take 1 tablet (5 mg total) by mouth every 6 (six) hours as needed for severe pain.   pantoprazole 40 MG tablet Commonly known as: PROTONIX Take 1 tablet (40 mg total) by mouth daily.   simvastatin 10 MG tablet Commonly known as: ZOCOR Take 10 mg by mouth daily.        Follow-up Information     Greer Pickerel, MD. Go on 02/05/2022.   Specialty: General Surgery Why: at 9:30am.  Please arrive 15 minutes prior to your appointment time.  Thank you. Contact information: [address]  [place] [address] [phone]         Maczis, Carlena Hurl, [place]. Go on 12/30/2021.   Specialty: General Surgery Why: at 2:30pm for Dr. [name].  Please arrive 15 minutes prior to your appointment time.  Thank  you. Contact information: [place] [address] [phone]                  The results of significant diagnostics from this hospitalization (including imaging, microbiology, ancillary and laboratory) are listed below for reference.    Significant Diagnostic Studies: No results found.  Labs: Basic Metabolic Panel: Recent Labs  Lab 12/09/21 0509  NA 138  K 5.3*  CL 104  CO2 25  GLUCOSE 152*  BUN 10  CREATININE 1.26*  CALCIUM 9.6   Liver Function Tests: Recent Labs  Lab 12/09/21 0509  AST 56*  ALT 85*  ALKPHOS 34*  BILITOT 0.7  PROT 7.4  ALBUMIN 4.1    CBC: Recent Labs  Lab 12/08/21 2107 12/09/21 0509 12/10/21 0044  WBC 11.6* 10.1 10.6*  NEUTROABS  --  9.0* 7.8*  HGB 15.4 14.8 17.2*  HCT 45.9 45.1 52.2*  MCV 89.8 91.1 90.9  PLT 230 242 157    CBG: Recent Labs  Lab 12/09/21 1638 12/09/21 2001 12/09/21 2338 12/10/21 0414 12/10/21 0739  GLUCAP 96 103* 104* 101* 121*    Principal Problem:   S/P laparoscopic sleeve gastrectomy   Time coordinating discharge: 20 min  Signed:  Gayland Curry, MD [facility] Surgery, [place] [phone] 12/10/2021, 2:06 PM

## 2021-12-10 NOTE — Progress Notes (Signed)
Discharge instructions discussed with patient and family, verbalized agreement and understanding 

## 2021-12-10 NOTE — Progress Notes (Signed)
Delayed note entry.  Pt seen & examined 12/09/21  Afebrile, no tachycardia, some HTN  Pt sitting/slept in bedside chair with CPAP Wife in his bed Alert, nontoxic Walked several times Finishing up water  Denies nausea; some soreness in abdomen Not really pain/discomfort with drinking  Small bump in K Cbc ok  Cont bari diet  Cont vte prophylaxis Pt will need lovenox on dc for prophylaxis Remove K from IVF, change IVF Will see how does thru and see if meets dc goals  [name]. [name], MD, FACS General, Bariatric, & Minimally Invasive Surgery [facility] Surgery,  [place]

## 2021-12-11 ENCOUNTER — Other Ambulatory Visit (HOSPITAL_COMMUNITY): Payer: Self-pay

## 2021-12-11 ENCOUNTER — Telehealth (HOSPITAL_COMMUNITY): Payer: Self-pay | Admitting: *Deleted

## 2021-12-11 NOTE — Telephone Encounter (Signed)
1.  Tell me about your pain and pain management? Pt denies any current pain.  2.  Let's talk about fluid intake.  How much total fluid are you taking in? Pt states that he is getting in at least 64oz of fluid including protein shakes, bottled water, and Gatorade 2.  Pt instructed to assess status and suggestions daily utilizing Hydration Action Plan on discharge folder and to call [facility] if in the "red zone".   3.  How much protein have you taken in the last 2 days? Pt states he is meeting his goal of 80g of protein each day with the protein shakes.  4.  Have you had nausea?  Tell me about when have experienced nausea and what you did to help? Pt denies nausea.   5.  Has the frequency or color changed with your urine? Pt states that he is urinating "fine" with no changes in frequency or urgency.     6.  Tell me what your incisions look like? "Incisions look fine". Pt denies a fever, chills.  Pt states incisions are not swollen, open, or draining.  Pt encouraged to call [facility] if incisions change.   7.  Have you been passing gas? BM? Pt states that he is having BMs. Last BM 12/11/21.    8.  If a problem or question were to arise who would you call?  Do you know contact numbers for [facility], [facility], and [facility]? Pt denies dehydration symptoms.  Pt can describe s/sx of dehydration.  Pt knows to call [facility] for surgical, [facility] for nutrition, and [facility] for non-urgent questions or concerns.   9.  How has the walking going? Pt states he is walking around and able to be active without difficulty.   10. Are you still using your incentive spirometer?  If so, how often? Pt states that he is not doing the I.S.   Pt encouraged to use incentive spirometer, at least 10x every hour while awake until he sees the surgeon.  11.  How are your vitamins and calcium going?  How are you taking them? Pt states that he is taking his supplements and vitamins without difficulty.  LOVENOX: Pt states that he is taking the Lovenox  injections without difficulty.  Reinforced education about taking injections q12h and rotating injection sites.  Pt also instructed to monitor for unusual bruising and/or signs of bleeding.   Reminded patient that the first 30 days post-operatively are important for successful recovery.  Practice good hand hygiene, wearing a mask when appropriate (since optional in most places), and minimizing exposure to people who live outside of the home, especially if they are exhibiting any respiratory, GI, or illness-like symptoms.

## 2021-12-23 ENCOUNTER — Encounter: Payer: Self-pay | Admitting: Dietician

## 2021-12-23 ENCOUNTER — Encounter: Payer: 59 | Attending: General Surgery | Admitting: Dietician

## 2021-12-23 DIAGNOSIS — E669 Obesity, unspecified: Secondary | ICD-10-CM | POA: Diagnosis present

## 2021-12-23 NOTE — Progress Notes (Signed)
2 Week Post-Operative Nutrition Class   Patient was seen on 12/23/2021 for Post-Operative Nutrition education at the [facility].    Surgery date: 12/08/2021 Surgery type: sleeve  Anthropometrics  Start weight at [facility]: 468.3 lbs (date: 06/09/2021)  Height: 69 in Weight today: 431.3 lbs. BMI: 61.89 kg/m2     Clinical  Medical hx: HTN, DM, gout Medications: see list  Labs: creatinine 1.45, lipase 122 Notable signs/symptoms: headaches multiple times a week Any previous deficiencies? No Bowel Habits: Every day to every other day no complaints   Body Composition Scale 12/23/2021  Current Body Weight 431.3  Total Body Fat % 45.7  Visceral Fat 55  Fat-Free Mass % 54.2   Total Body Water % 35.2  Muscle-Mass lbs 81.5  BMI 62.1  Body Fat Displacement          Torso  lbs 122.6         Left Leg  lbs 24.5         Right Leg  lbs 24.5         Left Arm  lbs 12.2         Right Arm   lbs 12.2      The following the learning objectives were met by the patient during this course: Identifies Phase 3 (Soft, High Proteins) Dietary Goals and will begin from 2 weeks post-operatively to 2 months post-operatively Identifies appropriate sources of fluids and proteins  Identifies appropriate fat sources and healthy verses unhealthy fat types   States protein recommendations and appropriate sources post-operatively Identifies the need for appropriate texture modifications, mastication, and bite sizes when consuming solids Identifies appropriate fat consumption and sources Identifies appropriate multivitamin and calcium sources post-operatively Describes the need for physical activity post-operatively and will follow MD recommendations States when to call healthcare provider regarding medication questions or post-operative complications   Handouts given during class include: Phase 3A: Soft, High Protein Diet Handout Phase 3 High Protein Meals Healthy Fats    Follow-Up Plan: Patient will follow-up at [facility] in 6 weeks for 2 month post-op nutrition visit for diet advancement per MD.

## 2021-12-30 ENCOUNTER — Telehealth: Payer: Self-pay | Admitting: Dietician

## 2021-12-30 NOTE — Telephone Encounter (Signed)
RD called pt to verify fluid intake once starting soft, solid proteins 2 week post-bariatric surgery.   Daily Fluid intake:  Daily Protein intake:  Bowel Habits:   Concerns/issues:   Left voice message 

## 2022-02-04 ENCOUNTER — Encounter: Payer: 59 | Attending: General Surgery | Admitting: Skilled Nursing Facility1

## 2022-02-04 ENCOUNTER — Encounter: Payer: Self-pay | Admitting: Skilled Nursing Facility1

## 2022-02-04 DIAGNOSIS — E669 Obesity, unspecified: Secondary | ICD-10-CM

## 2022-02-04 NOTE — Progress Notes (Signed)
Bariatric Nutrition Follow-Up Visit Medical Nutrition Therapy    Surgery date: 12/08/2021 Surgery type: sleeve  Anthropometrics  Start weight at [facility]: 468.3 lbs (date: 06/09/2021)  Weight today: pt declined   Clinical  Medical hx: HTN, DM, gout Medications: see list  Labs:  Notable signs/symptoms: headaches multiple times a week Any previous deficiencies? No    Body Composition Scale 12/23/2021  Current Body Weight 431.3  Total Body Fat % 45.7  Visceral Fat 55  Fat-Free Mass % 54.2   Total Body Water % 35.2  Muscle-Mass lbs 81.5  BMI 62.1  Body Fat Displacement          Torso  lbs 122.6         Left Leg  lbs 24.5         Right Leg  lbs 24.5         Left Arm  lbs 12.2         Right Arm   lbs 12.2     Lifestyle & Dietary Hx  Pt states he has been weighing daily: Dietitian advised pt to do once a week instead to find patterns.  Pt states his gout has been terrible so he wishes to add in more food groups to cut back on those occurrences.  Pt states he will start weight lifting when his surgeon clears him.   Estimated daily fluid intake: 60 oz Estimated daily protein intake: 100 g Supplements: multi and calcium  Current average weekly physical activity: walking 7 days a week 20 minutes   24-Hr Dietary Recall: 2 protein shakes  First Meal: 1 egg and [place] sausage Snack:   Second Meal: baked chicken or pinto beans Snack:  jerky Third Meal: chicken or fish Snack: sugar free popcicle Beverages: water, gatorade zero  Post-Op Goals/ Signs/ Symptoms Using straws: no Drinking while eating: no Chewing/swallowing difficulties: no Changes in vision: no Changes to mood/headaches: no Hair loss/changes to skin/nails: no Difficulty focusing/concentrating: no Sweating: no Limb weakness: no Dizziness/lightheadedness: no Palpitations: no  Carbonated/caffeinated beverages: no N/V/D/C/Gas: no Abdominal pain: no Dumping syndrome: no    NUTRITION DIAGNOSIS   Overweight/obesity ([place]-3.3) related to past poor dietary habits and physical inactivity as evidenced by completed bariatric surgery and following dietary guidelines for continued weight loss and healthy nutrition status.     NUTRITION INTERVENTION Nutrition counseling (C-1) and education (E-2) to facilitate bariatric surgery goals, including:  The importance of consuming adequate calories as well as certain nutrients daily due to the body's need for essential vitamins, minerals, and fats The importance of daily physical activity and to reach a goal of at least 150 minutes of moderate to vigorous physical activity weekly (or as directed by their physician) due to benefits such as increased musculature and improved lab values The importance of intuitive eating specifically learning hunger-satiety cues and understanding the importance of learning a new body: The importance of mindful eating to avoid grazing behaviors  Encouraged patient to honor their body's internal hunger and fullness cues.  Throughout the day, check in mentally and rate hunger. Stop eating when satisfied not full regardless of how much food is left on the plate.  Get more if still hungry 20-30 minutes later.  The key is to honor satisfaction so throughout the meal, rate fullness factor and stop when comfortably satisfied not physically full. The key is to honor hunger and fullness without any feelings of guilt or shame.  Pay attention to what the internal cues are, rather than any external  factors. This will enhance the confidence you have in listening to your own body and following those internal cues enabling you to increase how often you eat when you are hungry not out of appetite and stop when you are satisfied not full.  Encouraged pt to continue to eat balanced meals inclusive of non starchy vegetables 2 times a day 7 days a week Encouraged pt to choose lean protein sources: limiting beef, pork, sausage, hotdogs, and lunch  meat Encourage pt to choose healthy fats such as plant based limiting animal fats Encouraged pt to continue to drink a minium 64 fluid ounces with half being plain water to satisfy proper hydration    Goals: Avoid jerky and milk whey protein shakes to avoid gouty flares -Continue to aim for a minimum of 64 fluid ounces 7 days a week with at least 30 ounces being plain water  -Eat non-starchy vegetables 2 times a day 7 days a week  -Start out with soft cooked vegetables today and tomorrow; if tolerated begin to eat raw vegetables or cooked including salads  -Eat your 3 ounces of protein first then start in on your non-starchy vegetables; once you understand how much of your meal leads to satisfaction and not full while still eating 3 ounces of protein and non-starchy vegetables you can eat them in any order   -Continue to aim for 30 minutes of activity at least 5 times a week  -Do NOT cook with/add to your food: alfredo sauce, cheese sauce, barbeque sauce, ketchup, fat back, butter, bacon grease, grease, Crisco, OR SUGAR   Handouts Provided Include  Meal ideas   Learning Style & Readiness for Change Teaching method utilized: Visual & Auditory  Demonstrated degree of understanding via: Teach Back  Readiness Level: action Barriers to learning/adherence to lifestyle change: non identified  RD's Notes for Next Visit Assess adherence to pt chosen goals    MONITORING & EVALUATION Dietary intake, weekly physical activity, body weight  Next Steps Patient is to follow-up in 3 months

## 2022-04-16 ENCOUNTER — Encounter: Payer: Self-pay | Admitting: Skilled Nursing Facility1

## 2022-04-16 ENCOUNTER — Encounter: Payer: 59 | Attending: General Surgery | Admitting: Skilled Nursing Facility1

## 2022-04-16 DIAGNOSIS — E669 Obesity, unspecified: Secondary | ICD-10-CM | POA: Insufficient documentation

## 2022-04-16 NOTE — Progress Notes (Signed)
Bariatric Nutrition Follow-Up Visit Medical Nutrition Therapy    Surgery date: 12/08/2021 Surgery type: sleeve  Anthropometrics  Start weight at [facility]: 468.3 lbs (date: 06/09/2021)  Weight today: pt declined   Clinical  Medical hx: HTN, DM, gout Medications: see list  Labs:  Notable signs/symptoms: headaches multiple times a week Any previous deficiencies? No  Body Composition Scale 12/23/2021  Current Body Weight 431.3  Total Body Fat % 45.7  Visceral Fat 55  Fat-Free Mass % 54.2   Total Body Water % 35.2  Muscle-Mass lbs 81.5  BMI 62.1  Body Fat Displacement          Torso  lbs 122.6         Left Leg  lbs 24.5         Right Leg  lbs 24.5         Left Arm  lbs 12.2         Right Arm   lbs 12.2     Lifestyle & Dietary Hx  Pt states he will be changing to full time day shift in a couple weeks.  Pt states he has lost close to 100 pounds. Pt states he can now do more things he once could not with ease.  Pt states he needs the acid reducer sometimes.  Pt states his gout will bother him here and there but more rare.  Pt states he can be a bit picky so it leave shim feeling bored with his choices.   Estimated daily fluid intake: 64+ oz Estimated daily protein intake: 100 g Supplements: multi and calcium  Current average weekly physical activity: walking 7 days a week 20 minutes   24-Hr Dietary Recall: 2 snacks at work  First Meal 12am: chicken or fish and broccoli and green beans Snack:  sunflower seeds  Second Meal: chicken in salad: romaine, cucumbers, tomatoes, cheese, lite ranch dressing  Snack:  cheese +  pepperoni slices Third Meal 4-7:09GG: chicken or fish with green beans or broccoli or salad and 1-2 times a week fruit Snack: nuts or beans Beverages: water, gatorade zero, water + flavorings, black coffee   Post-Op Goals/ Signs/ Symptoms Using straws: no Drinking while eating: no Chewing/swallowing difficulties: no Changes in vision: no Changes to  mood/headaches: no Hair loss/changes to skin/nails: no Difficulty focusing/concentrating: no Sweating: no Limb weakness: no Dizziness/lightheadedness: no Palpitations: no  Carbonated/caffeinated beverages: no N/V/D/C/Gas: no, every other day mirilax  Abdominal pain: no Dumping syndrome: no    NUTRITION DIAGNOSIS  Overweight/obesity (Stuart-3.3) related to past poor dietary habits and physical inactivity as evidenced by completed bariatric surgery and following dietary guidelines for continued weight loss and healthy nutrition status.     NUTRITION INTERVENTION Nutrition counseling (C-1) and education (E-2) to facilitate bariatric surgery goals, including:  The importance of consuming adequate calories as well as certain nutrients daily due to the body's need for essential vitamins, minerals, and fats The importance of daily physical activity and to reach a goal of at least 150 minutes of moderate to vigorous physical activity weekly (or as directed by their physician) due to benefits such as increased musculature and improved lab values The importance of intuitive eating specifically learning hunger-satiety cues and understanding the importance of learning a new body: The importance of mindful eating to avoid grazing behaviors  Encouraged patient to honor their body's internal hunger and fullness cues.  Throughout the day, check in mentally and rate hunger. Stop eating when satisfied not full regardless of how much  food is left on the plate.  Get more if still hungry 20-30 minutes later.  The key is to honor satisfaction so throughout the meal, rate fullness factor and stop when comfortably satisfied not physically full. The key is to honor hunger and fullness without any feelings of guilt or shame.  Pay attention to what the internal cues are, rather than any external factors. This will enhance the confidence you have in listening to your own body and following those internal cues enabling you to  increase how often you eat when you are hungry not out of appetite and stop when you are satisfied not full.  Encouraged pt to continue to eat balanced meals inclusive of non starchy vegetables 2 times a day 7 days a week Encouraged pt to choose lean protein sources: limiting beef, pork, sausage, hotdogs, and lunch meat Encourage pt to choose healthy fats such as plant based limiting animal fats Encouraged pt to continue to drink a minium 64 fluid ounces with half being plain water to satisfy proper hydration  Importance of vegetables To have an overall healthy diet, adult men and women are recommended to consume anywhere from 2-3 cups of vegetables daily. Vegetables provide a wide range of vitamins and minerals such as vitamin A, vitamin C, potassium, and folic acid. According to the [facility], including fruit and vegetables daily may reduce the risk of cardiovascular disease, certain cancers, and other non-communicable diseases.    Handouts Provided Include  Meal ideas   Learning Style & Readiness for Change Teaching method utilized: Visual & Auditory  Demonstrated degree of understanding via: Teach Back  Readiness Level: action Barriers to learning/adherence to lifestyle change: non identified  RD's Notes for Next Visit Assess adherence to pt chosen goals    MONITORING & EVALUATION Dietary intake, weekly physical activity, body weight  Next Steps Patient is to follow-up in 3 months

## 2022-07-06 ENCOUNTER — Encounter (INDEPENDENT_AMBULATORY_CARE_PROVIDER_SITE_OTHER): Payer: Self-pay | Admitting: *Deleted

## 2022-08-17 ENCOUNTER — Other Ambulatory Visit: Payer: Self-pay | Admitting: Cardiology

## 2022-08-17 NOTE — Telephone Encounter (Signed)
Left message to return call 

## 2022-08-18 NOTE — Telephone Encounter (Signed)
Per Dr. Wayland Salinas f/u just as needed with [place]. ok to refill aspirin however but future would need to be from pcp

## 2022-09-16 ENCOUNTER — Other Ambulatory Visit: Payer: Self-pay | Admitting: Cardiology

## 2022-11-25 ENCOUNTER — Ambulatory Visit: Payer: 59 | Admitting: Skilled Nursing Facility1

## 2023-03-15 ENCOUNTER — Other Ambulatory Visit: Payer: Self-pay | Admitting: Cardiology

## 2023-07-16 ENCOUNTER — Encounter (HOSPITAL_COMMUNITY): Payer: Self-pay | Admitting: *Deleted

## 2023-08-15 IMAGING — RF DG UGI W SINGLE CM
12 of 13 series · 14 of 24 positions shown · non-contrast
Comparison: NONE.

CLINICAL DATA: Pre bariatric surgery

EXAM:
DG UGI W SINGLE CM
TECHNIQUE: Scout radiograph was obtained. Single contrast examination was
performed using thin liquid barium. This exam was performed by
[REDACTED] , and was supervised and interpreted by Dr. Blumbo.
FLUOROSCOPY TIME:  Radiation Exposure Index (as provided by the
fluoroscopic device): 122.8 mGy
If the device does not provide the exposure index:
Fluoroscopy Time:  dictate in minutes and seconds
Number of Acquired Images:

[Series 1: t abdomen · 1 of 1 slices shown]
[im 1/1]
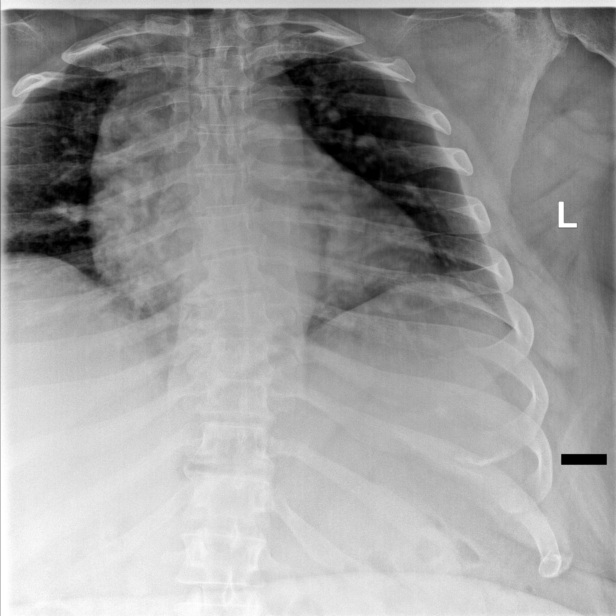

[Series 3: cp_standard · 1 of 111 frames shown (1 of 4)]
[frame 95/111]
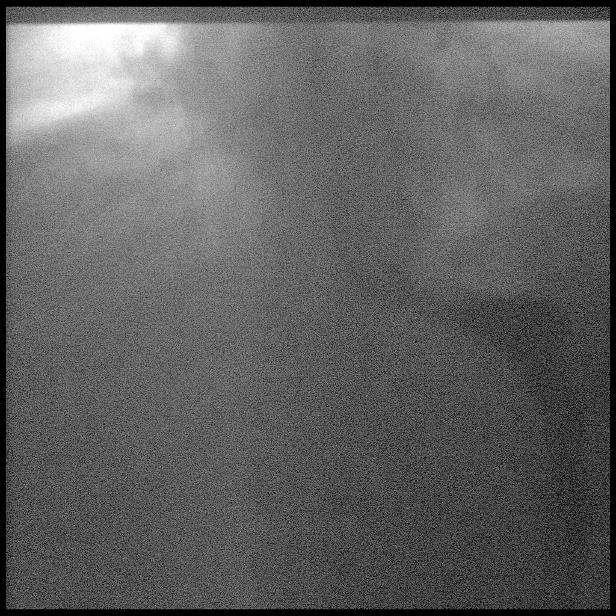

[Series 4: fluoro_barium 2fps_bw · 1 of 2 frames shown (1 of 7)]
[frame 2/2]
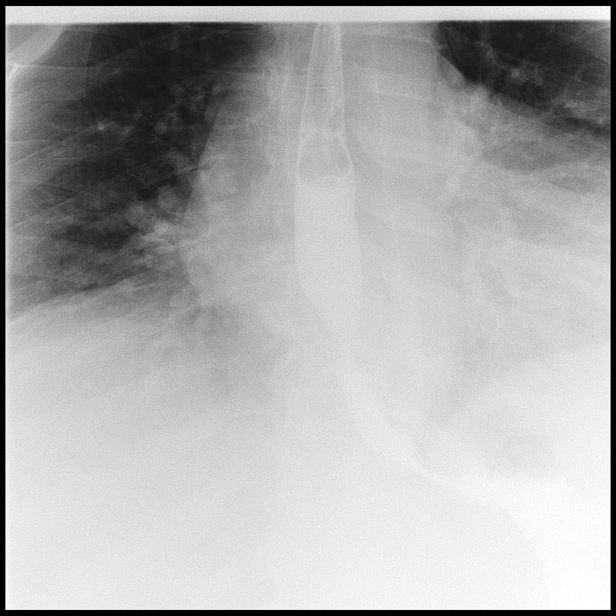

[Series 6: fluoro_barium 2fps_bw · 1 of 3 frames shown (2 of 7)]
[frame 3/3]
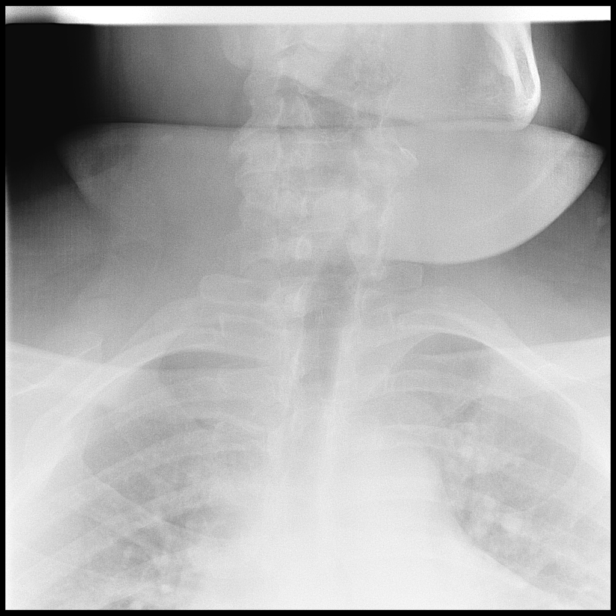

[Series 8: fluoro_barium 2fps_bw · 1 of 3 frames shown (3 of 7)]
[frame 1/3]
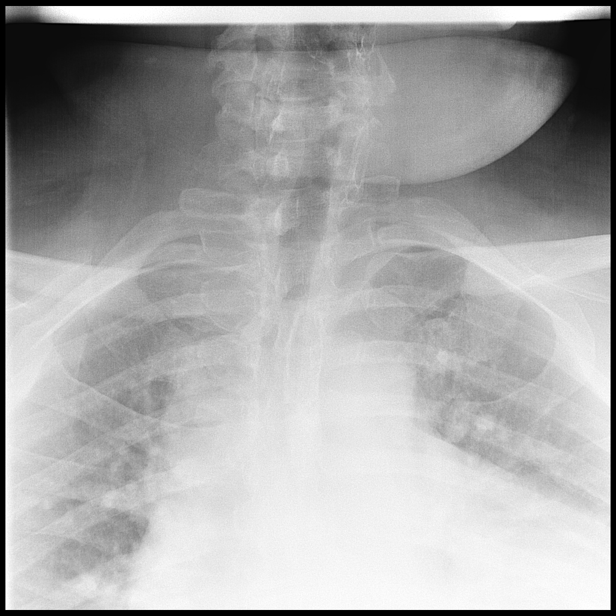

[Series 9: cp_standard · 1 of 58 frames shown (2 of 4)]
[frame 9/58]
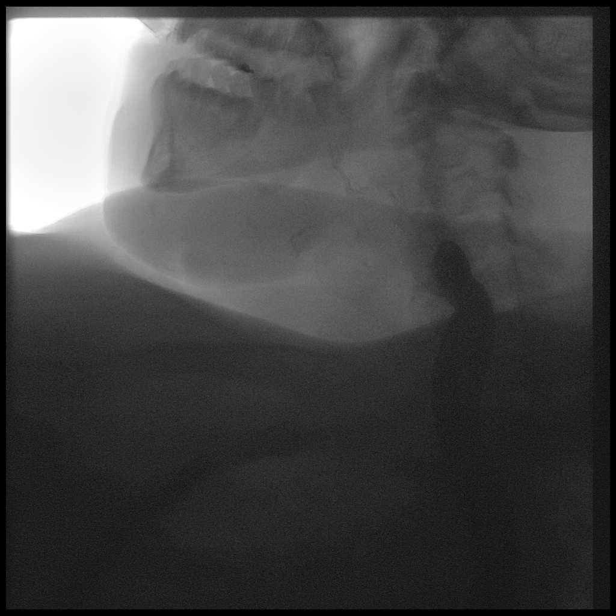

[Series 10: cp_standard · 2 of 180 frames shown (3 of 4)]
[frame 28/180]
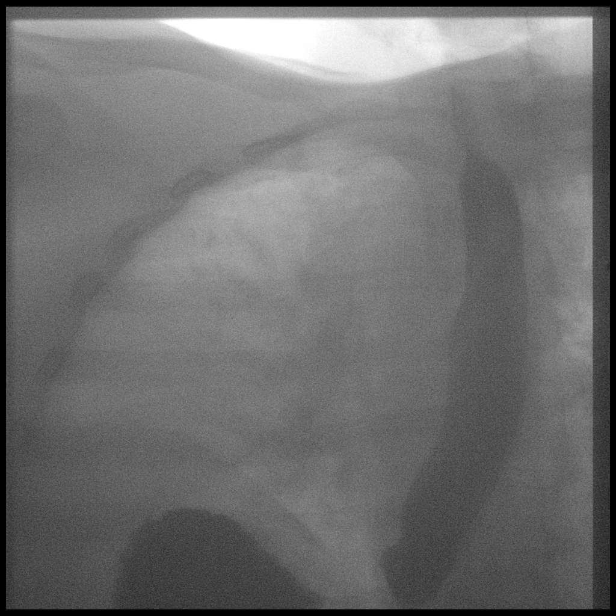
[frame 91/180]
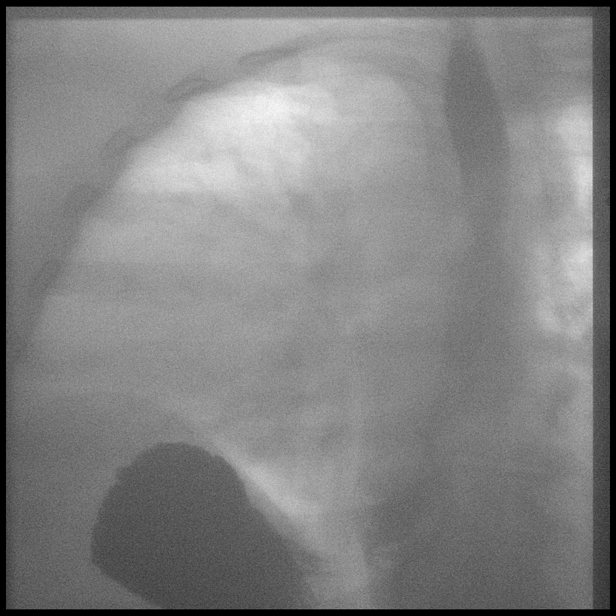

[Series 11: fluoro_barium 2fps_bw · 1 of 3 frames shown (4 of 7)]
[frame 3/3]
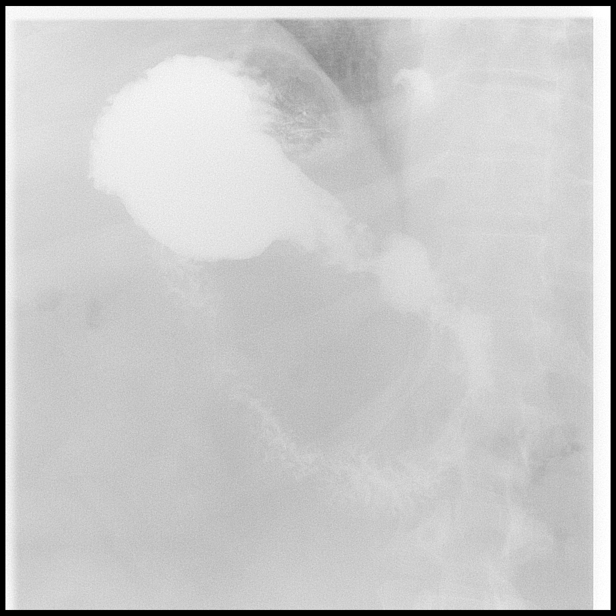

[Series 12: fluoro_barium 2fps_bw · 1 of 5 frames shown (5 of 7)]
[frame 5/5]
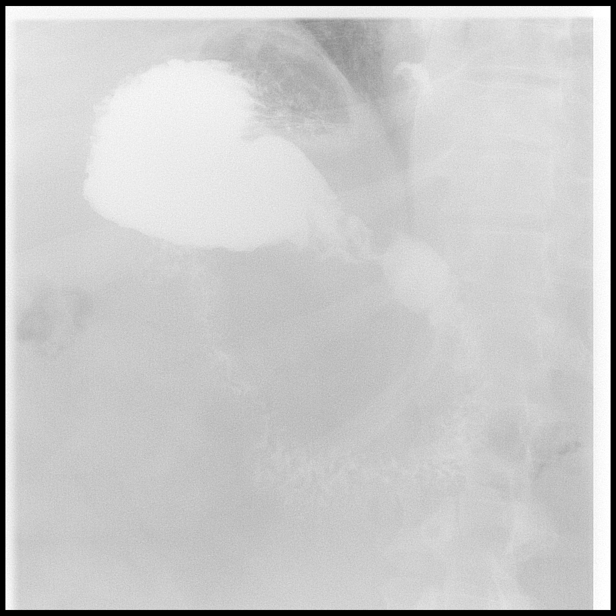

[Series 14: fluoro_barium 2fps_bw · 2 of 4 frames shown (6 of 7)]
[frame 1/4]
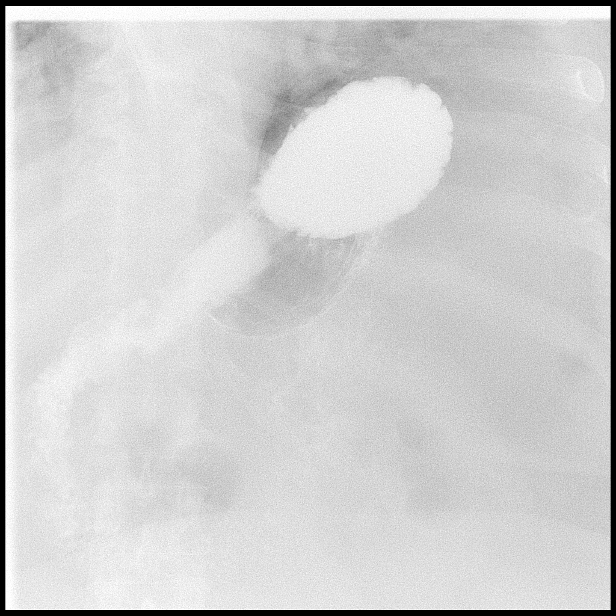
[frame 4/4]
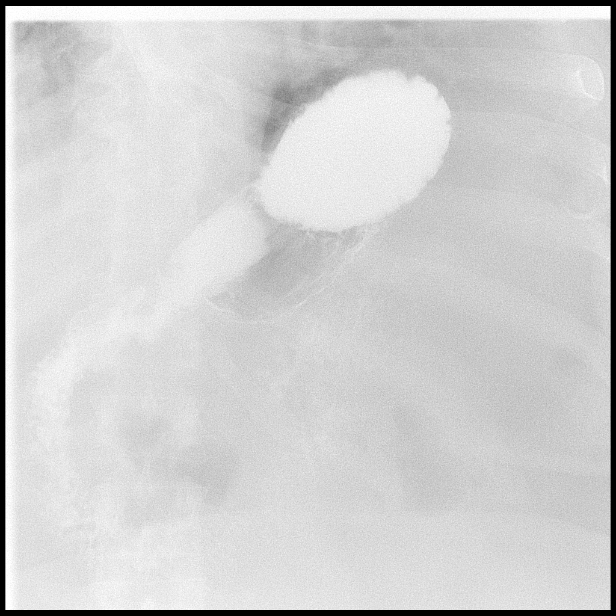

[Series 15: fluoro_barium 2fps_bw · 1 of 4 frames shown (7 of 7)]
[frame 4/4]
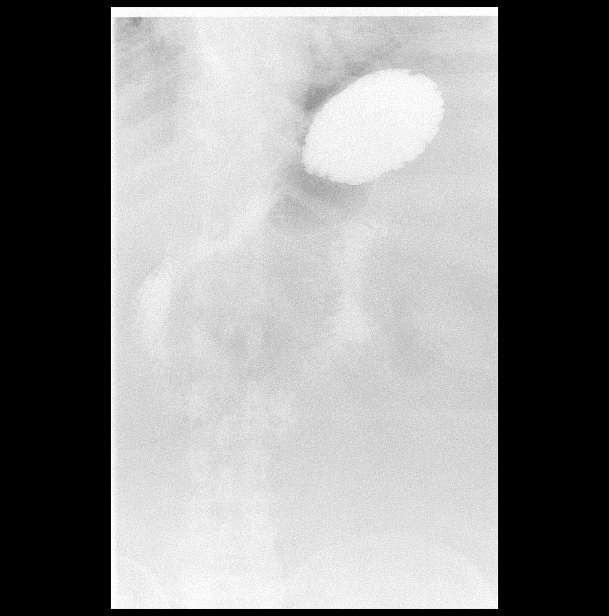

[Series 16: cp_standard · 1 of 74 frames shown (4 of 4)]
[frame 63/74]
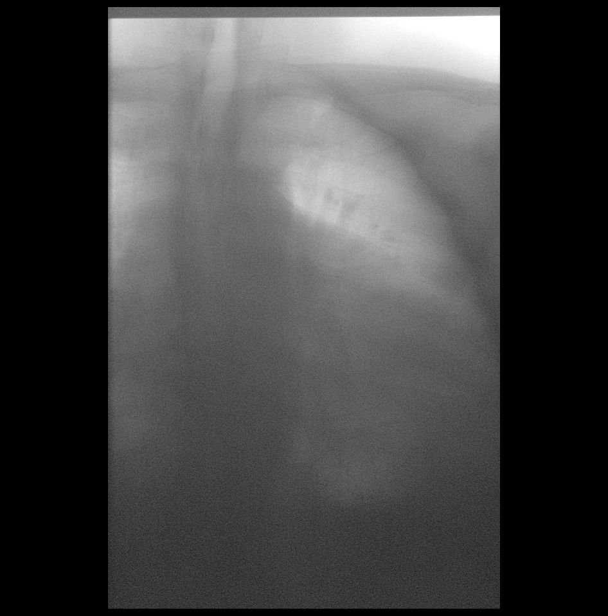

[14 of 24 positions shown; findings below may reference images not displayed]

FINDINGS: Scout Radiograph: No bowel obstruction

Esophagus:  Normal appearance.

Esophageal motility:  Within normal limits.

Gastroesophageal reflux:  None visualized.

Ingested 13mm barium tablet:  Passed normally

Stomach: Normal appearance. No hiatal hernia.

Gastric emptying: Normal.

Duodenum:  Normal appearance.

Other:  None.
IMPRESSION: Normal upper GI.
